# Patient Record
Sex: Female | Born: 1970 | Race: Black or African American | Hispanic: No | Marital: Single | Smoking: Current every day smoker
Health system: Southern US, Community
[De-identification: ages and names within clinical notes are randomized; demographics above are authoritative.]

## PROBLEM LIST (undated history)

## (undated) DIAGNOSIS — I10 Essential (primary) hypertension: Secondary | ICD-10-CM

## (undated) DIAGNOSIS — IMO0001 Reserved for inherently not codable concepts without codable children: Secondary | ICD-10-CM

## (undated) DIAGNOSIS — F17200 Nicotine dependence, unspecified, uncomplicated: Secondary | ICD-10-CM

## (undated) DIAGNOSIS — M2740 Unspecified cyst of jaw: Secondary | ICD-10-CM

## (undated) HISTORY — DX: Nicotine dependence, unspecified, uncomplicated: F17.200

## (undated) HISTORY — DX: Unspecified cyst of jaw: M27.40

## (undated) HISTORY — DX: Reserved for inherently not codable concepts without codable children: IMO0001

## (undated) HISTORY — DX: Essential (primary) hypertension: I10

---

## 1998-10-04 ENCOUNTER — Other Ambulatory Visit: Admission: RE | Admit: 1998-10-04 | Discharge: 1998-10-04 | Payer: Self-pay | Admitting: Obstetrics

## 2003-04-01 ENCOUNTER — Inpatient Hospital Stay (HOSPITAL_COMMUNITY): Admission: AD | Admit: 2003-04-01 | Discharge: 2003-04-01 | Payer: Self-pay | Admitting: *Deleted

## 2003-04-11 ENCOUNTER — Encounter: Admission: RE | Admit: 2003-04-11 | Discharge: 2003-04-11 | Payer: Self-pay | Admitting: Internal Medicine

## 2003-04-25 ENCOUNTER — Encounter: Admission: RE | Admit: 2003-04-25 | Discharge: 2003-04-25 | Payer: Self-pay | Admitting: Internal Medicine

## 2003-05-10 ENCOUNTER — Encounter (INDEPENDENT_AMBULATORY_CARE_PROVIDER_SITE_OTHER): Payer: Self-pay | Admitting: *Deleted

## 2003-05-10 ENCOUNTER — Encounter: Admission: RE | Admit: 2003-05-10 | Discharge: 2003-05-10 | Payer: Self-pay | Admitting: Obstetrics and Gynecology

## 2003-05-19 ENCOUNTER — Ambulatory Visit (HOSPITAL_COMMUNITY): Admission: RE | Admit: 2003-05-19 | Discharge: 2003-05-19 | Payer: Self-pay | Admitting: *Deleted

## 2003-05-30 ENCOUNTER — Encounter: Admission: RE | Admit: 2003-05-30 | Discharge: 2003-05-30 | Payer: Self-pay | Admitting: Internal Medicine

## 2003-07-28 ENCOUNTER — Encounter: Admission: RE | Admit: 2003-07-28 | Discharge: 2003-07-28 | Payer: Self-pay | Admitting: Obstetrics and Gynecology

## 2004-12-13 IMAGING — US US PELVIS COMPLETE MODIFY
1 series · 18 of 25 positions shown · non-contrast
Comparison: none

CLINICAL DATA: Heavy menses.  Evaluate for polycystic ovarian disease.
TRANSABDOMINAL AND ENDOVAGINAL PELVIC ULTRASOUND:
Multiple images of the uterus and adnexal were obtained using a transabdominal and endovaginal approaches.
The uterus has a maximal sagittal length of 7.9 cm and maximal AP width of 3.0 cm.  A homogeneous uterine myometrium is seen.  The endometrial canal is trilayered in appearance with a maximal AP width of .9 cm and would correlate with the patient?s periovulatory phase of the cycle and with the patient?s LMP of 05/06/03.
Both ovaries are seen with the left ovary measuring 2.5 x 3.9 x 2.1 cm and the left ovary measuring 2.5 x 3.0 x 1.8 cm.  Multiple small subcentimeter follicles are seen bilaterally with no dominant follicle identified in this periovulatory patient.  While volumetrically the ovaries are within normal limits for size, the presence of the multiple small follicles with no sign of a dominant follicle in the ovulatory phase of the cycle would correspond with the possibility of polycystic ovarian disease.  
IMPRESSION
Normal uterus.  
Ovaries demonstrating multiple peripherally situated subcentimeter follicles with a normal ovarian volume.  The appearance would be compatible with polycystic ovarian disease, although it is not diagnostic as such.  Please see above report for discussion.

[Series 1: us pelvis complete modify · 18 of 45 slices shown]
[im 1/45]
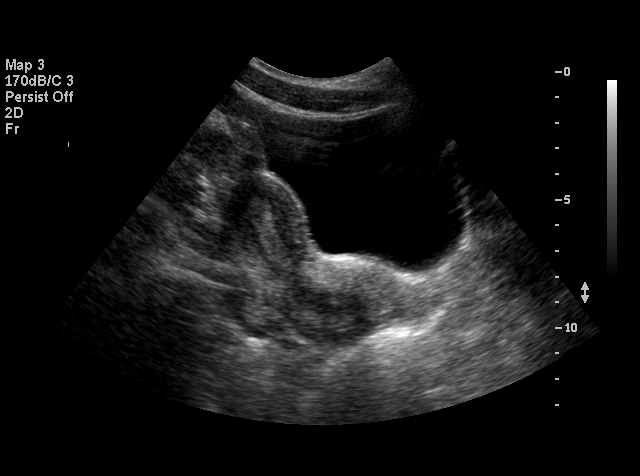
[im 4/45]
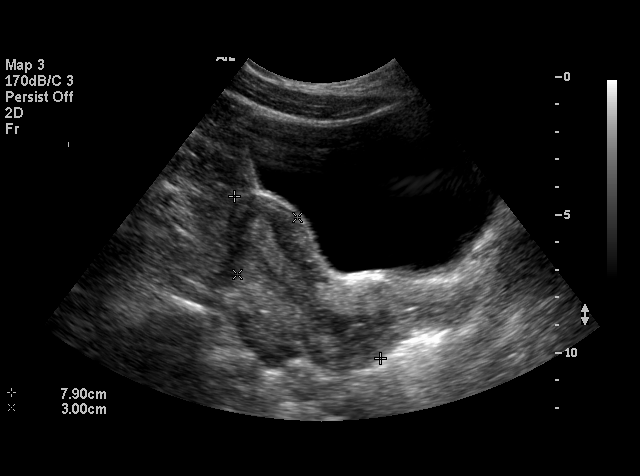
[im 6/45]
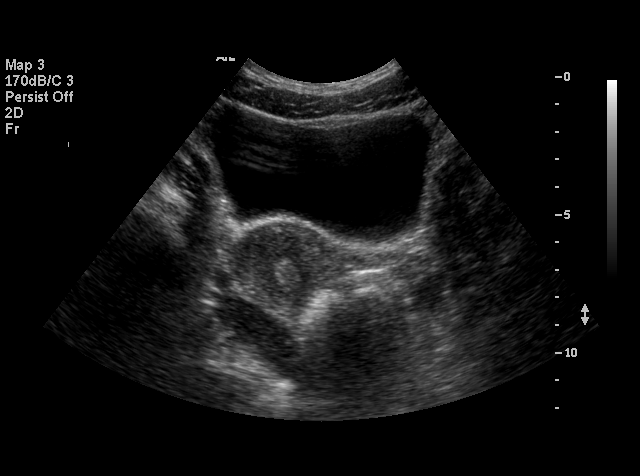
[im 8/45]
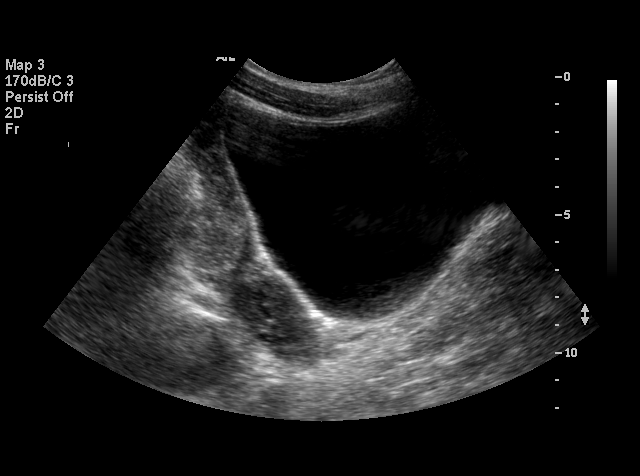
[im 12/45]
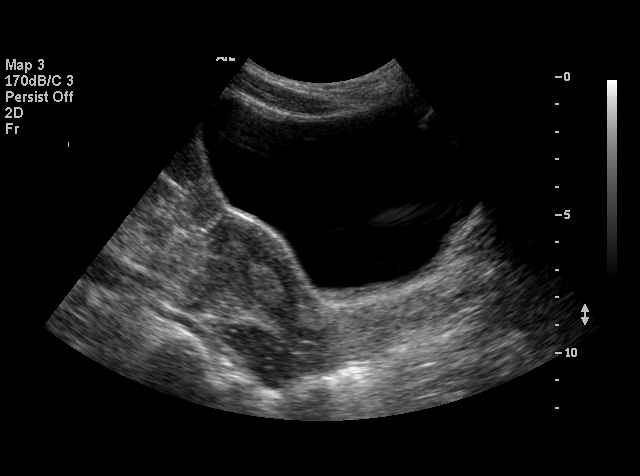
[im 13/45]
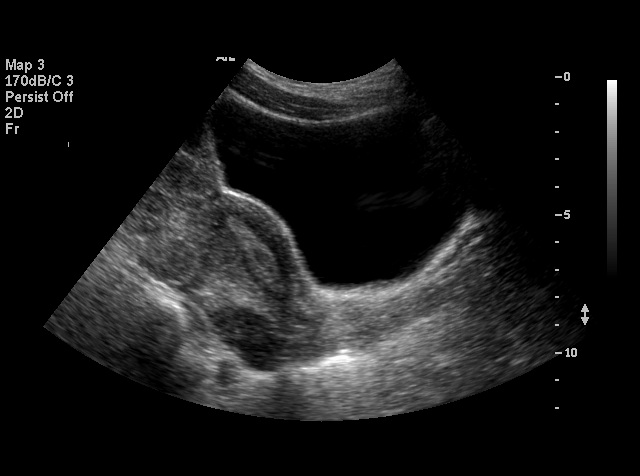
[im 17/45]
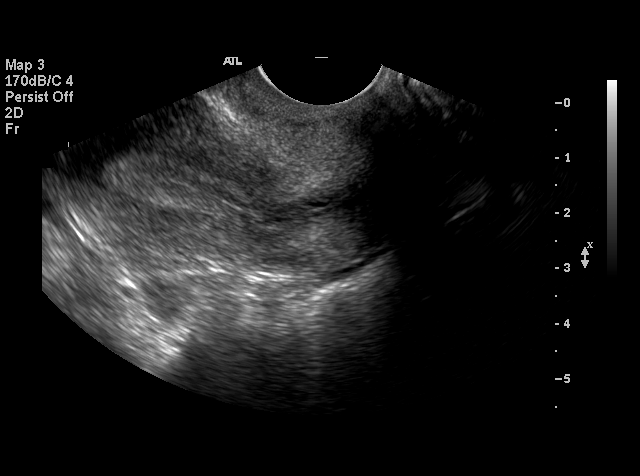
[im 19/45]
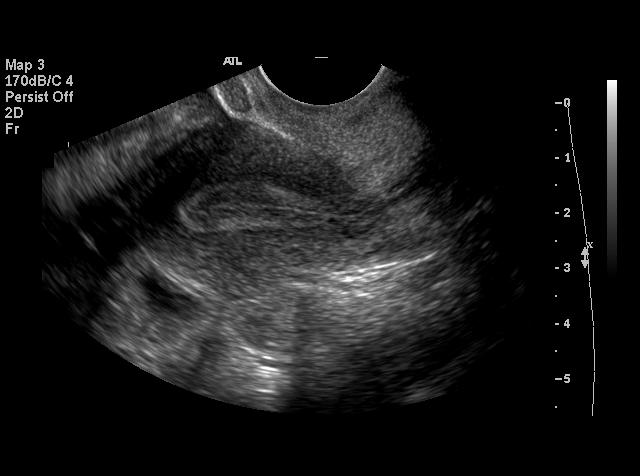
[im 21/45]
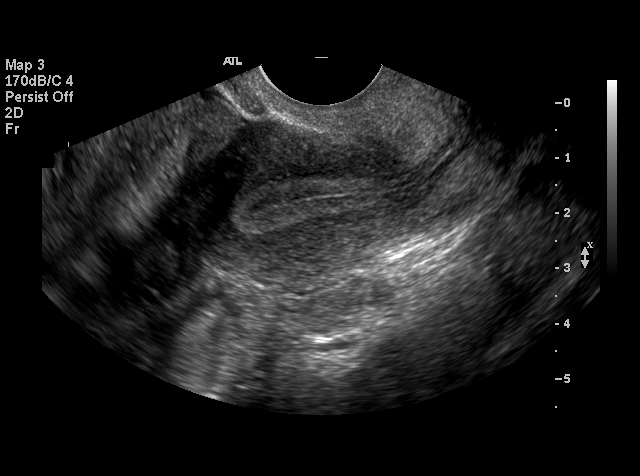
[im 24/45]
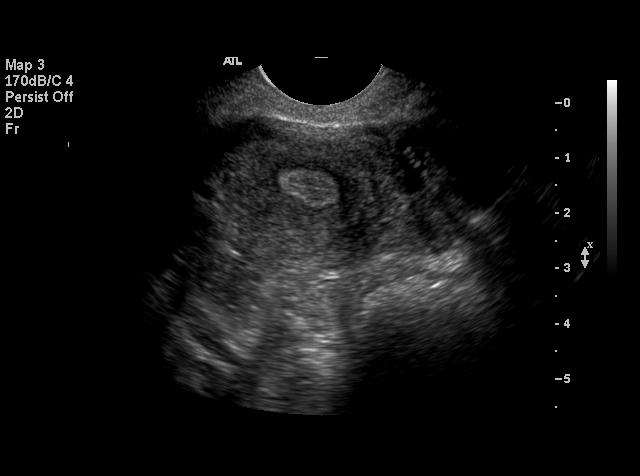
[im 26/45]
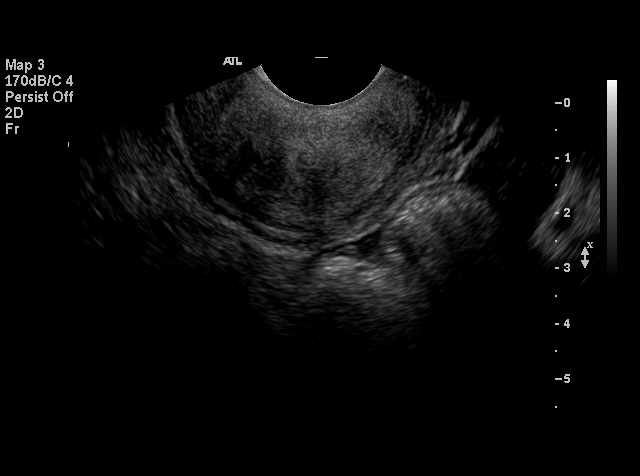
[im 28/45]
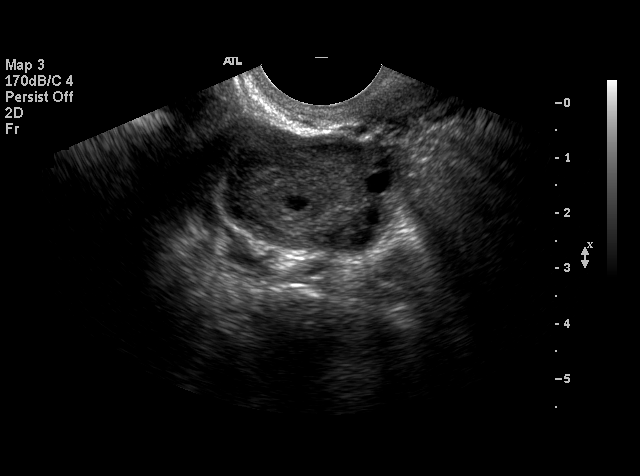
[im 32/45]
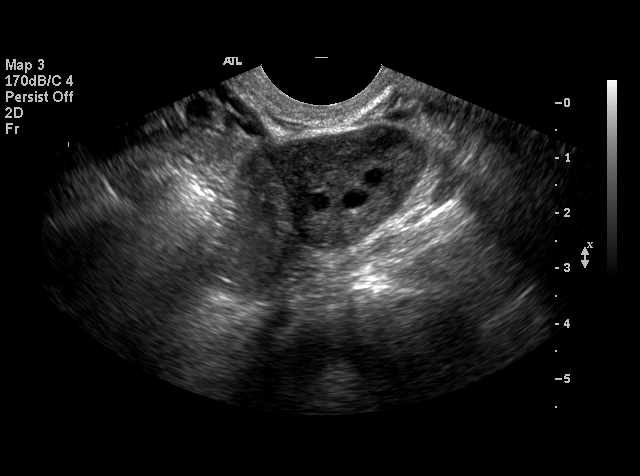
[im 34/45]
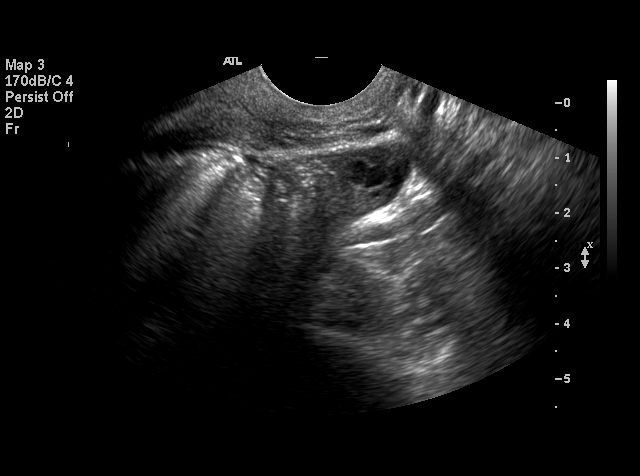
[im 37/45]
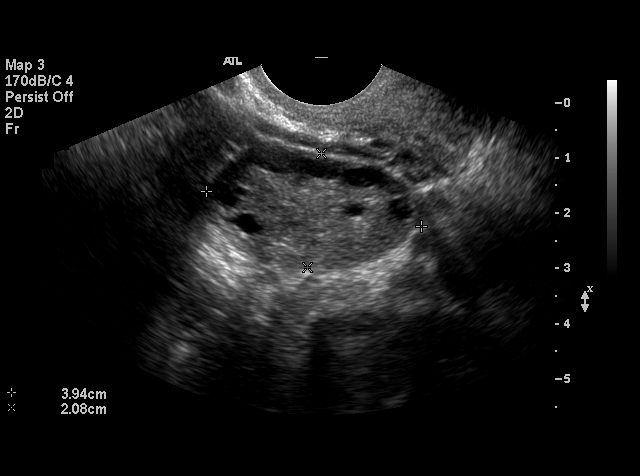
[im 39/45]
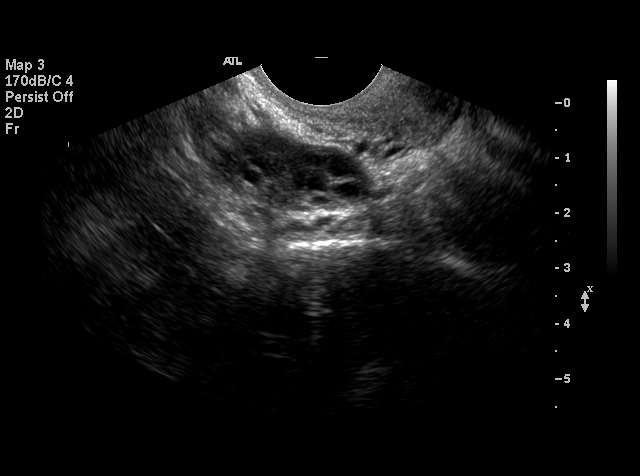
[im 41/45]
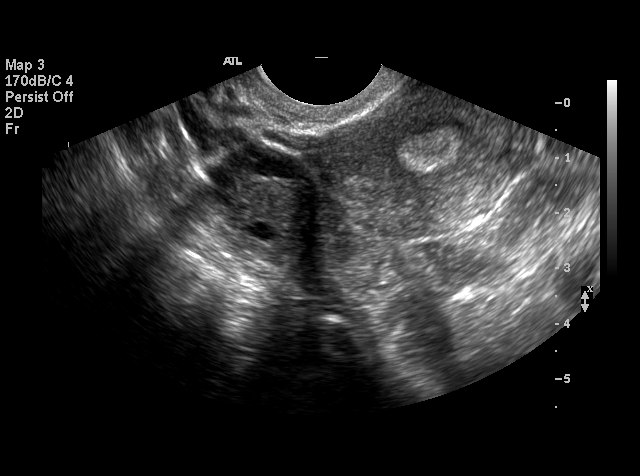
[im 45/45]
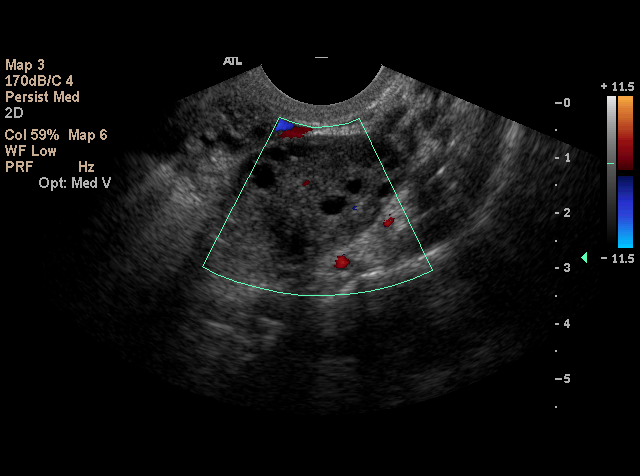

[18 of 25 positions shown; findings below may reference images not displayed]

## 2005-12-01 ENCOUNTER — Emergency Department (HOSPITAL_COMMUNITY): Admission: EM | Admit: 2005-12-01 | Discharge: 2005-12-01 | Payer: Self-pay | Admitting: Emergency Medicine

## 2006-09-11 ENCOUNTER — Emergency Department (HOSPITAL_COMMUNITY): Admission: EM | Admit: 2006-09-11 | Discharge: 2006-09-11 | Payer: Self-pay | Admitting: Emergency Medicine

## 2008-08-19 ENCOUNTER — Emergency Department (HOSPITAL_COMMUNITY): Admission: EM | Admit: 2008-08-19 | Discharge: 2008-08-19 | Payer: Self-pay | Admitting: Emergency Medicine
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
## 2010-06-11 ENCOUNTER — Inpatient Hospital Stay (HOSPITAL_COMMUNITY)
Admission: EM | Admit: 2010-06-11 | Discharge: 2010-06-16 | Payer: Self-pay | Source: Home / Self Care | Attending: Family Medicine | Admitting: Family Medicine

## 2010-06-11 ENCOUNTER — Emergency Department (HOSPITAL_COMMUNITY)
Admission: EM | Admit: 2010-06-11 | Discharge: 2010-06-11 | Disposition: A | Discharge status: Other Acute Inpatient Hospital | Payer: Self-pay | Source: Home / Self Care | Admitting: Emergency Medicine

## 2010-06-13 ENCOUNTER — Encounter: Payer: Self-pay | Admitting: Family Medicine

## 2010-06-14 ENCOUNTER — Encounter: Payer: Self-pay | Admitting: Family Medicine

## 2010-06-21 ENCOUNTER — Encounter: Payer: Self-pay | Admitting: Family Medicine

## 2010-06-21 ENCOUNTER — Ambulatory Visit: Admission: RE | Admit: 2010-06-21 | Discharge: 2010-06-21 | Payer: Self-pay | Source: Home / Self Care

## 2010-06-21 DIAGNOSIS — D638 Anemia in other chronic diseases classified elsewhere: Secondary | ICD-10-CM | POA: Insufficient documentation

## 2010-06-21 DIAGNOSIS — I10 Essential (primary) hypertension: Secondary | ICD-10-CM | POA: Insufficient documentation

## 2010-06-21 LAB — CONVERTED CEMR LAB
BUN: 23 mg/dL (ref 6–23)
CO2: 22 meq/L (ref 19–32)
Calcium: 8.9 mg/dL (ref 8.4–10.5)
Chloride: 105 meq/L (ref 96–112)
Creatinine, Ser: 1.97 mg/dL — ABNORMAL HIGH (ref 0.40–1.20)
Glucose, Bld: 63 mg/dL — ABNORMAL LOW (ref 70–99)
HCT: 29.3 % — ABNORMAL LOW (ref 36.0–46.0)
Hemoglobin: 9.5 g/dL — ABNORMAL LOW (ref 12.0–15.0)
MCHC: 32.4 g/dL (ref 30.0–36.0)
MCV: 99 fL (ref 78.0–100.0)
Platelets: 508 10*3/uL — ABNORMAL HIGH (ref 150–400)
Potassium: 4.8 meq/L (ref 3.5–5.3)
RBC: 2.96 M/uL — ABNORMAL LOW (ref 3.87–5.11)
RDW: 16.5 % — ABNORMAL HIGH (ref 11.5–15.5)
Sodium: 136 meq/L (ref 135–145)
WBC: 10.8 10*3/uL — ABNORMAL HIGH (ref 4.0–10.5)

## 2010-06-22 ENCOUNTER — Encounter: Payer: Self-pay | Admitting: Family Medicine

## 2010-06-22 ENCOUNTER — Telehealth: Payer: Self-pay | Admitting: Family Medicine

## 2010-07-06 ENCOUNTER — Ambulatory Visit: Admission: RE | Admit: 2010-07-06 | Discharge: 2010-07-06 | Payer: Self-pay | Source: Home / Self Care

## 2010-07-06 DIAGNOSIS — F17200 Nicotine dependence, unspecified, uncomplicated: Secondary | ICD-10-CM | POA: Insufficient documentation

## 2010-07-06 NOTE — H&P (Signed)
Mary Pitts, STAFF NO.:  [id number]  MEDICAL RECORD NO.:  [id number]          PATIENT TYPE:  INP  LOCATION:  2904                         FACILITY:  [facility]  PHYSICIAN:  Paula Compton, MD        DATE OF BIRTH:  02/25/1971  DATE OF ADMISSION:  06/11/2010 DATE OF DISCHARGE:                             HISTORY & PHYSICAL   PRIMARY CARE PHYSICIAN:  Unassigned.  CHIEF COMPLAINT:  Blurry vision.  HISTORY OF PRESENT ILLNESS:  This is a 40 year old female with a history of untreated hypertension who comes to her Urgent Care with several-day history of "feeling bad" and blurry vision, was sent to the ED from the urgent care where the patient was found to have a blood pressure of 235/157.  The patient endorses several days of nausea and emesis, but no chest pain, abdominal pain, shortness of breath or other complaints.  PAST MEDICAL HISTORY:  Hypertension.  PAST SURGICAL HISTORY:  None.  ALLERGIES:  No known drug allergies.  MEDICATIONS:  No medications.  SOCIAL HISTORY:  The patient lives alone with her boyfriend.  She has no children.  She is an International aid/development worker at [employer].  She smoked 1-1/2 packs per day x20 years.  Rare alcohol use.  No illicit drug use.  FAMILY HISTORY:  Mother died of cirrhosis due to alcoholism.  Father had an MI and coronary artery disease, unknown age.  Siblings, has a half- brother with a stroke at age 51.  REVIEW OF SYSTEMS:  Positive for headache, nausea, vomiting, nocturia, visual changes, dizziness, polyuria.  Negative for fevers, chills, weight change, chest pain, edema, palpitations, cough, wheezing, sputum, dysuria, hematemesis, numbness and weakness.  PHYSICAL EXAMINATION:  VITAL SIGNS:  Temperature 98, pulse 89, respirations 18, blood pressure currently 130/78, pulse ox 99% on room air. GENERAL:  No acute distress, hirsute female. HEENT:  Extraocular movements are intact.  Pupils are equally round and reactive  to light and accommodation.  No obvious papilledema or hemorrhage on funduscopic exam. NECK:  Supple. CARDIOVASCULAR:  Regular rate and rhythm, 2/6 early systolic murmur. LUNGS:  Clear to auscultation bilaterally. ABDOMEN:  Positive bowel sounds, soft, no tenderness to palpation. EXTREMITIES:  No lower extremity edema. NEURO:  Cranial nerves II-XII grossly intact.  No focal findings. MUSCULOSKELETAL:  Strength 5/5 in upper and lower extremities bilaterally.  LABORATORY FINDINGS AND STUDIES:  EKG; sinus rate 85, nonspecific ST changes. Chest x-ray; mild cardiomegaly, no active lung disease.  Alcohol level less than 5. Urinary drug screen negative. Cardiac enzymes, troponin 0.34, CK 104, CK-MB 4.3.  CBC; white blood cell count 13.62, hemoglobin 10, platelets 38. Complete metabolic panel; sodium 130, potassium 2, chloride 88, bicarbonate 33, BUN 24, creatinine 2.54, bilirubin 1.2, alk phos 48, AST 54, ALT 14, protein 6.4, albumin 3.2, calcium 8.6. Urinalysis positive for protein and large blood, negative for nitrites, leukocytes and glucose.  Microscopy, positive hyaline casts, negative white blood cells, rbcs too numerous to count.  ASSESSMENT AND PLAN:  This is a 40 year old with hypertensive urgency, acute renal failure, thrombocytopenia, hypokalemia. 1. Hypertensive urgency.  We will discontinue the patient's nitro and  labetalol drip as the patient's blood pressure today is much lower     than a goal of 25% reduction.  We will start metoprolol 50 mg p.o.     b.i.d. to keep blood pressure goal of systolic blood pressure 170-     190s and diastolic blood pressure of 100-105.  We will add a     calcium channel blocker if needed, additional agent after beta-     blockade.  We will recheck the patient's EKG.  Noted troponin     elevated expected due to renal failure and increased heart strains.     Further changes, will consult Cardiology. 2. Acute renal failure.  The patient  had a normal creatinine of 0.88     in March 2010.  Questionable is the patient has had acute     glomerulonephritis from hypertensive urgency or this is worsening     from chronic medical disease.  The patient also had many rbcs and     protein in her urine.  Consider renal ultrasound and Doppler to     further evaluate for signs of chronic medical disease versus to     evaluate for renal artery stenosis given poorly-controlled     hypertension.  Also obtain FENa, urine and sodium osmolality, 24-     hour urine and protein for further evaluation. 3. Hematology:  The patient has thrombocytopenia and anemia, not     previously to be seen in last laboratory studies in March 2010     greater than a year ago.  Question if the patient's anemia is from     blood loss from glomerulonephritis.  The patient has no history of     bleeding, also the patient's thrombocytopenia is of unclear     etiology.  We will obtain DIC panel, peripheral smear, and will get     HIV, hepatitis and labs to further evaluate. 4. Fluid, electrolytes and nutrition.  The patient is hypokalemic and     hyponatremic.  We will check a magnesium and recheck BMET for     potassium replacement.  The patient will likely need additional     replacement.  Questionable etiology due to possibly GI loss from     emesis versus renal losses versus mineralocorticoid     deficiency with renin-aldosterone ratio and continue to replete to     keep potassium greater than 4. 5. Prophylaxis.  SCDs due to thrombocytopenia. 6. Disposition.  We will discharge home pending workup.  The patient     will need PCP as an outpatient.     Delbert Harness, MD   ______________________________ Paula Compton, MD    KB/MEDQ  D:  06/12/2010  T:  06/12/2010  Job:  [id number]  Electronically Signed by Delbert Harness MD on 07/03/2010 11:19:05 PM Electronically Signed by Paula Compton MD on 07/06/2010 03:10:05 PM

## 2010-07-06 NOTE — Discharge Summary (Signed)
Mary Pitts, Mary Pitts             ACCOUNT NO.:  [id number]  MEDICAL RECORD NO.:  [id number]          PATIENT TYPE:  INP  LOCATION:  6737                         FACILITY:  [facility]  PHYSICIAN:  Wayne A. Sheffield Pitts, M.D.    DATE OF BIRTH:  [birth year]  DATE OF ADMISSION:  06/11/2010 DATE OF DISCHARGE:  06/16/2010                              DISCHARGE SUMMARY   DISCHARGE DIAGNOSES: 1. Hypertensive emergency. 2. Acute renal failure. 3. Chronic heart failure. 4. Anemia. 5. Thrombocytopenia.  DISCHARGE MEDICATIONS: 1. Metoprolol 100 mg p.o. b.i.d. 2. Clonidine 0.1 mg p.o. t.i.d. 3. Norvasc 10 mg p.o. daily. 4. Lisinopril 2.5 mg p.o. daily. 5. Aspirin 81 mg p.o. daily. 6. Clear Eyes ophthalmic solution over the counter p.r.n.  LABORATORY DATA:  Labs on admission showed CBC of 13.2, hemoglobin 10, hematocrit 29, platelets 38.  CMET showed sodium 130, potassium less than 2, chloride 88, CO2 of 33, glucose 99, BUN 24, creatinine 2.54, total bili 1.2, alk phos 48, AST 58, ALT 14, total protein 6.4, albumin 3.2, calcium 8.6, troponin was 0.34, CK-MB was 4.3 with a relative index of 4.1.  Urine drug screen was negative.  On admission, BNP was greater than 3200.  LABS AT DISCHARGE:  BNP was 1921.  BMET showed sodium 137, potassium 4.5, chloride 107, CO2 of 26, glucose 99, BUN 32, creatinine 2.58, calcium 8.9.  CBC showed WBC 9.4, hemoglobin 8.5, hematocrit 26.1, platelets 197.  PROCEDURES: 1. June 11, 2010, chest x-ray showed borderline to mild     cardiomegaly, no active lung disease. 2. CT abdomen and pelvis on June 12, 2010, showed bilateral     pleural effusions with interlobular septal thickening in the lower     lobes.  Coarse dystrophic calcification of indeterminate etiology.     Probably benign finding.  No evidence of mass. 3. June 12, 2010, Myoview was negative for pharmacologic stress-     induced ischemia.  Ejection fraction 41%.  Probable mild     attenuation of  the anterior wall. 4. 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  BRIEF HOSPITAL COURSE:  Mary Pitts is a 40 year old female who was admitted to the [facility] secondary to hypertensive emergency.  The patient was complaining of blurry vision and presented to Urgent Care on day of admission with blood pressure up to 235/157.  This was repeated.  The patient is therefore sent to the emergency department.  Please see H and P for full history and physical. 1. Hypertensive emergency.  This is actual hypertensive emergency as     the patient has evidence of end-organ damage.  The patient had     thrombocytopenia, acute renal failure, PRES syndrome, headache, and     blurry vision.  The patient was initially started on labetalol drip     and nitroglycerin in the emergency department.  This dropped her     blood  pressure greater than goal 25% reduction.  We therefore     stopped this and started on metoprolol 50 mg p.o. b.i.d.  This was     increased to metoprolol 100 mg p.o. b.i.d.  We had to continue     increasing her blood pressure medications so that she was     discharged home on Norvasc, Lopressor 100 mg, and clonidine 0.1 mg     t.i.d.  Troponin was also elevated.  Cardiology was consulted, felt     this was not likely secondary to cardiac event.  Myoview also ruled     out any ischemia.  Of note, the patient is being discharged on     clonidine 3 tablets a day.  It is not likely she will follow this     as it is difficult to take any medications 3 times a day.  We will     stop her lisinopril once her creatinine comes down and drop her     clonidine from the medication list. 2. Acute renal failure.  The patient has a normal creatinine.  Her     baseline creatinine 0.88  based on 2010 labs.  This is most likely     acute renal failure secondary to both most likely chronic kidney     disease as well as hypertensive emergency.  We did consult Renal     Service, we felt this was chronic kidney disease related with acute     renal failure on top of that.  They would like to follow up with     her on outpatient basis.  We did start the patient on lisinopril     2.5 mg, but the patient's creatinine continued to drop even on this     medication.  On discharge, her creatinine was 2.5 mg.  We need to     follow up her creatinine on an outpatient basis within the next     week to make sure she does not bump her creatinine any higher while     being on lisinopril.  Renal ultrasound was not obtained while she     was in-house.  CT of the abdomen and pelvis was negative for any     adrenal masses. 3. CHF.  Myoview did show the patient to have an EF of 40%.  On     discharge, she was on ACE and beta-blocker.  She did not have any     evidence of volume overload, therefore we are not having her on any     diuretics except spironolactone.  Her BNP was still elevated on     discharge, but was trending down from greater than 3200 on     admission.  This is likely chronic in nature.  Plan to continue her     on her medications so that I have her blood pressure to maximal     medical therapy.  The patient is to follow up with Dr. Algie Coffer,     Cardiology in 2 weeks.  As noted, the patient did not have any     signs of volume overload with no lower extremity edema and no     crackles on exam. 4. Anemia.  We did consult Hem/Onc during this admission.  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  The patient did also have thrombocytopenia     as well.  Please see below.  Hem/Onc consultation did agree that     the anemia is likely secondary to underlying cardiomyopathy and     chronic renal insufficiency.  The  patient did not require any     transfusions while in-house. 5. Thrombocytopenia likely secondary to dilution effect.  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  TTP was felt to be a possibility, but     after peripheral smear, she has decided to not actually diagnose     TTP.  Hem/Onc did agree that she probably had low-grade     disseminated intravascular coagulation as a side effect from her     uncontrolled hypertension.  This will also need to be followed up     in an outpatient basis.  DISCHARGE FOLLOWUP APPOINTMENTS:  The patient has a followup appointment already with Dr. Algie Coffer in 2 weeks.  She needs to be followed by [facility].  She also will be followed by [facility].  As it is a Saturday and a holiday, we are going to make appointment for her this weekend.  She was discharged home with instructions to call on Tuesday when the clinic reopens to schedule an appointment.  I have also flagged the administrative staff to call her if she does not call us.  DISCHARGE ISSUES AND FOLLOWUP:  As noted above, the patient was discharged home on Norvasc, Lopressor, clonidine, and lisinopril.  We would like to increase her lisinopril if possible to maximize medical management of cardiomyopathy.  We would also like to drop the clonidine so she does not have the problem of taking a medicine 3 times a day which is very difficult.  We will need to follow up with her creatinine within the next week to make sure that she is not having any problems her creatinine being on lisinopril and ensure that it is continuing to trend downwards.  Also need to obtain a CBC to maintain that her hemoglobin has indeed stabilized and platelets are continuing to trend upwards as they were on discharge.  DISCHARGE CONDITION:  The patient discharged to home in good medical condition.     Renold Don,  MD   ______________________________ Mary Pitts, M.D.    JW/MEDQ  D:  06/16/2010  T:  06/17/2010  Job:  [id number]  Electronically Signed by Renold Don  on 06/29/2010 08:45:32 AM Electronically Signed by Zachery Dauer M.D. on 07/06/2010 10:01:49 PM

## 2010-07-16 NOTE — Consult Note (Signed)
Mary Pitts, DAWE NO.:  [id number]  MEDICAL RECORD NO.:  [id number]           PATIENT TYPE:  LOCATION:                                 FACILITY:  PHYSICIAN:  Dr. Welton Flakes, Konrad Dolores               DATE OF BIRTH:  06-03-1971  DATE OF CONSULTATION:  06/13/2010 DATE OF DISCHARGE:                                CONSULTATION   REASON FOR CONSULTATION:  Thrombocytopenia - anemia.  REQUESTING PHYSICIAN:  Paula Compton, MD  HISTORY OF PRESENT ILLNESS:  [name] is a 40 year old African American female with a history of untreated hypertension, transferred from [facility] with blurry vision; malignant hypertension, reaching a systolic of 235, diastolic of 500; nausea and vomiting, requiring emergent treatment.  She was noted to have acute renal insufficiency.  In addition, the patient was found to be anemic with an H and H of 10 and 29 as well as a thrombocytopenia with platelet count of 38,000.  Her white count was 12.2.  These lab values were performed on June 11, 2010.  These findings are new when compared to prior labs.  Her DIC panel shows normal PTT and INR, her D-dimer was 3.32 and fibrinogen was 314.  Of note, a smear on June 11, 2010, showed a few schistocytes present.  A new smear confirms that.  LDH is elevated at 696.  Retic count has been ordered.  Other labs currently pending include haptoglobin, anemia panel, and SPEP.  Retic count is pending as well.  CT of the abdomen and pelvis on June 12, 2010, with no contrast, reveals coarse dystrophic calcifications in the splenic hilum, of unknown etiology, possibly benign.  In addition, bilateral pleural effusion with interlobular septal thickening in the lower lungs is seen. Adrenals are normal.  We were asked to see the patient in consultation with recommendations regarding her hematological abnormalities.  PAST MEDICAL HISTORY: 1. Hypertension, uncontrolled. 2. Hypertensive  cardiomyopathy, diagnosed this admission. 3. Renal insufficiency, diagnosed this admission. 4. Tobacco history. 5. 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 6. Ejection fraction 41% per 2-D echo, June 12, 2010.  PAST SURGICAL HISTORY - PROCEDURE:  Status post D and C at age 78, for what she describes as cancer, but there are no records.  ALLERGIES:  No known drug allergies.  MEDICATIONS:  Norvasc, Catapres, metoprolol, Protonix, K-Dur, Apresoline.  REVIEW OF SYSTEMS:  She denies any fever, chills, or night sweats.  No headaches or confusion.  She has had a dizziness and decrease in vision for 1 week prior to admission, now resolved.  No dysphagia.  No respiratory complaints.  No chest pain at this time.  No palpitations. She denies any cough or abdominal pain.  No early satiation or weight loss, no GERD symptoms.  She did have acute nausea and vomiting, now resolved.  Denies any blood in the stools or dark stools.  She denies blood in the urine, although she  noticed that is darker, accompanied by nocturia and polyuria.  No back pain.  No numbness or swelling.  She has increasing fatigue.  Of note, the patient admits to eat very significant amount of ice, sometimes a whole bag of ice.  In addition, she drinks major amounts of soda a day.  Rest of the review of systems is negative. Her menarche was at age 74.  She admits to irregular periods.  Last menstrual period was in early December, and lasted a week.  She did not note a significant amount of clots.  FAMILY HISTORY:  Mother died with alcoholic cirrhosis.  Father died with CAD.  She has one half brother, who had CVA at age 51.  SOCIAL HISTORY:  The patient is single.  She lives with boyfriend.  No children.  She smokes one and a half pack a day of cigarettes for at least 20 years.  She is International aid/development worker at  [employer]  Lives in [place].  On health maintenance, the patient is not up to date with primary care.  PHYSICAL EXAMINATION:  GENERAL:  This is a thin 40 year old [place] American female, in no acute distress, alert and oriented x3. VITAL SIGNS:  Blood pressure 160/110, pulse 98, respirations 20, temperature 97.8, pulse oximetry 94% in room air. HEENT:  Normocephalic, atraumatic.  PERRLA.  Sclerae anicteric.  Oral cavity without lesions or thrush.  There is presence of significant amount of hirsutism. NECK:  Supple.  No cervical or supraclavicular masses. LUNGS:  Clear to auscultation bilaterally with no axillary masses. CARDIOVASCULAR:  Regular rate and rhythm.  A 2/6 systolic murmur.  No rubs or gallops. ABDOMEN:  Soft, nontender.  Bowel sounds x4.  No hepatosplenomegaly. There is a presence of abdominal hair. EXTREMITIES:  No clubbing or cyanosis.  No edema.  No inguinal masses. SKIN:  Without bruising or petechial rash. BREASTS:  Not examined. GENITOURINARY:  Deferred. RECTAL:  Deferred. MUSCULOSKELETAL:  No spinal tenderness. NEUROLOGIC:  Nonfocal.  Last hemoglobin 7.9, hematocrit 22.7, white count 55, white count 12.2, MCV 93.  For a white count of 13.2 on June 11, 2010, her ANC was 10.5, monocytes 1.1, lymphocytes 1.5.  LDH 696, D-dimer 3.32.  Sodium 134, potassium 2.8, BUN 25, creatinine 2.45, glucose 99.  Total bilirubin 1.2, alkaline phosphatase 48, AST 58, ALT 14, total protein 6.4, albumin 3.2, calcium 8.8, TSH 1.158, magnesium 1.8.  Troponin 0.32, PTT 27, PT 14.1, INR 1.07, blood cultures negative.  HIV negative.  BNP greater than 3200.  Hepatitis is negative.  Urine sodium 43, normal urine cultures, nondiagnostic, alcohol negative, drug screen negative, MRSA negative.  UA positive for 300 of protein, large blood, cloudy, no nitrites.  ASSESSMENT AND PLAN:  Dr. Welton Flakes has seen and evaluated the patient.  This is a 40 year old woman with uncontrolled  hypertension, hypertensive cardiomyopathy, renal insufficiency, likely of longstanding duration. The patient has not been followed by a physician in quite some time. The patient has not been taking blood pressure medications.  She was admitted with anemia and thrombocytopenia and thus will be evaluated and managed for: 1. Anemia, likely secondary to underlying cardiomyopathy and chronic     disease, renal insufficiency.  Suggest transfusion when     symptomatic, but we will avoid fluid overload.  The patient may     benefit from erythrocyte-stimulating agents therapy, erythrocyte-     stimulating agents, but we need to use them carefully in this     patient with uncontrolled hypertension and cardiomyopathy.  Once     her hypertension is controlled, these agents may be appropriate,     but not now. 2. Thrombocytopenia, likely secondary to dilution effect.  Thrombotic     thrombocytopenic purpura is not thought at this time.  Dr. Welton Flakes has     reviewed her peripheral smear, and they are the same schistocytes,     but not diagnostic of thrombotic thrombocytopenic purpura.  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 Suggest monitor her CBC, LDH, and total bilirubin.  Thank you very much for allowing [place] the opportunity to participate in the care of this nice patient.     Marlowe Kays, P.A.   ______________________________ Drue Second, MD    SW/MEDQ  D:  06/14/2010  T:  06/15/2010  Job:  [id number]  Electronically Signed by Marlowe Kays P.A. on 06/15/2010 08:04:43 AM Electronically Signed by Drue Second MD on 07/16/2010 05:41:28 PM

## 2010-07-18 ENCOUNTER — Telehealth (INDEPENDENT_AMBULATORY_CARE_PROVIDER_SITE_OTHER): Payer: Self-pay | Admitting: *Deleted

## 2010-07-19 NOTE — Assessment & Plan Note (Signed)
Summary: 2 WKS F/U VISIT PER MD/RH   Vital Signs:  Patient profile:   40 year old female Height:      64.75 inches Weight:      145.6 pounds BMI:     24.50 Pulse rate:   76 / minute BP sitting:   128 / 85  (left arm)  Vitals Entered By: Theresia Lo RN (July 06, 2010 1:31 PM) CC: follow up Is Patient Diabetic? No Pain Assessment Patient in pain? no        Primary Provider:  Dessa Phi  CC:  follow up.  History of Present Illness: HTN f/u  HTN- Reviewed meds. Pt taking all as prescribed. Elevated BP at visit with Cardiologist (Dr. Algie Coffer) 167/110. Dr. Algie Coffer prescribed Norvasc 5 mg by mouth q d. Pt wanted to see me before filling script. BP this AM at home 160/124. Pt denies CP, SOB, worsening blurred vision.   For vision: pt [facility]ulated at [facility] with dilated eye exam. Referred to an Ophthalmologist for evaluation for Lasik.  Diet- pt diet is low salt. Limited. Not enjoying food.   Stress- work related. stress. Smoking a few puffs daily at work. Trying hard to cut back. Interested in other stress relievers. Ready to quit.   Exercise- no regular. Has a WII. Has an overweight dog and a park across the street.   Screening: no pap done in a few years. No mammaogram done ever.   Allergies: No Known Drug Allergies  Physical Exam  General:  Well-developed,well-nourished,in no acute distress; alert,appropriate and cooperative throughout examination Lungs:  Normal respiratory effort, chest expands symmetrically. Lungs are clear to auscultation, no crackles or wheezes. Heart:  Normal rate and regular rhythm. S1 and S2 normal without gallop, murmur, click, rub or other extra sounds. Skin:  Healing R foot blister that has ruptured.      Impression & Recommendations:  Problem # 1:  HYPERTENSION (ICD-401.9) Assessment Deteriorated Was better controlled on previous regimen. Restart norvasc per Cards. Pt to f/u with cards in 1-2 weeks. Check CBC and Cr  at f/u visit 4 weeks. Her updated medication list for this problem includes:    Lopressor 100 Mg Tabs (Metoprolol tartrate) ..... One tab two times a day    Lisinopril 5 Mg Tabs (Lisinopril) ..... One tab daily    Hydrochlorothiazide 25 Mg Tabs (Hydrochlorothiazide) ..... One tab po daily    Norvasc 5 Mg Tabs (Amlodipine besylate) ..... One tab daily at lunch  Future Orders: Comp [facility] [id number]) ... 06/19/2011 CBC[facility] ([id number]) ... 06/18/2011  Problem # 2:  Preventive Health Care (ICD-V70.0) Screening pap and mamogram at f/u visit.  Problem # 3:  TOBACCO USER (ICD-305.1) Pt ready to quit. Interested in other relaxation techniques.  Trying to simulate smoking for relief as suggested by cardiologist. Interesed in exercising (walking during work breaks etc.) Will assess progress at next visit.   Complete Medication List: 1)  Lopressor 100 Mg Tabs (Metoprolol tartrate) .... One tab two times a day 2)  Lisinopril 5 Mg Tabs (Lisinopril) .... One tab daily 3)  Aspir-low 81 Mg Tbec (Aspirin) .... One tab daily. 4)  Hydrochlorothiazide 25 Mg Tabs (Hydrochlorothiazide) .... One tab po daily 5)  Norvasc 5 Mg Tabs (Amlodipine besylate) .... One tab daily at lunch  Patient Instructions: 1)  Ms. Su Hilt, 2)  Thank you for coming in.  3)  For your BP: 4)  Start taking new medicine Amlodipine at lunch. 5)  Keep your f/u appt w/ Dr. Algie Coffer.  6)  Bring you BP monitor at your next visit. 7)  -lab draw befrore f/u visit-CBC and CMET.  8)  -f/u w/ me in 4 weeks.  9)  -Dr. Armen Pickup   Orders Added: 1)  Comp Met[facility] [[id number]] 2)  CBC[facility] [[id number]] 3)  [facility] [99203]     Vital Signs:  Patient profile:   40 year old female Height:      64.75 inches Weight:      145.6 pounds BMI:     24.50 Pulse rate:   76 / minute BP sitting:   128 / 85  (left arm)  Vitals Entered By: Theresia Lo RN (July 06, 2010 1:31 PM)   Serial Vital Signs/Assessments:  Time      Position   BP       Pulse  Resp  Temp     By 1:35 PM             132/90                         Theresia Lo RN                     132/90                         Dessa Phi MD                     130/90                         Dessa Phi MD above BP taken manually. Theresia Lo RN  July 06, 2010 1:36 PM   Prevention & Chronic Care Immunizations   Influenza vaccine: Not documented    Tetanus booster: Not documented    Pneumococcal vaccine: Not documented  Other Screening   Pap smear: Not documented   Pap smear action/deferral: Deferred  (07/06/2010)   Pap smear due: 08/15/2010    Mammogram: Not documented   Mammogram action/deferral: Deferred  (07/06/2010)   Mammogram due: 08/15/2010   Smoking status: quit  (06/21/2010)  Lipids   Total Cholesterol: Not documented   LDL: Not documented   LDL Direct: Not documented   HDL: Not documented   Triglycerides: Not documented  Hypertension   Last Blood Pressure: 128 / 85  (07/06/2010)   Serum creatinine: 1.97  (06/21/2010)   Serum potassium 4.8  (06/21/2010) CMP ordered   Self-Management Support :    Hypertension self-management support: Not documented

## 2010-07-19 NOTE — Letter (Signed)
Summary: Out of Work  [facility]  [address]   [place], [place] [address]   Phone: [phone]  Fax: [phone]    June 21, 2010   Employee:  Mary Pitts    To Whom It May Concern:   For Medical reasons, please excuse the above named employee from work for the following dates:  Start:   06/11/10  End:   06/22/10  [name] may return to work on Monday, Jun 25, 2010. Please excuse her for all follow-up doctor appointments.   If you need additional information, please feel free to contact our office.         Sincerely,    Dessa Phi MD

## 2010-07-19 NOTE — Miscellaneous (Signed)
Summary: Discharge Summary  Clinical Lists Changes  NAME:  Mary Pitts             ACCOUNT NO.:  [id number]      MEDICAL RECORD NO.:  [id number]          PATIENT TYPE:  INP      LOCATION:  6737                         FACILITY:  [facility]      PHYSICIAN:  Wayne A. Sheffield Slider, M.D.    DATE OF BIRTH:  07/27/1970      DATE OF ADMISSION:  06/11/2010   DATE OF DISCHARGE:  06/16/2010                                  DISCHARGE SUMMARY         DISCHARGE DIAGNOSES:   1. Hypertensive emergency.   2. Acute renal failure.   3. Chronic heart failure.   4. Anemia.   5. Thrombocytopenia.      DISCHARGE MEDICATIONS:   1. Metoprolol 100 mg p.o. b.i.d.   2. Clonidine 0.1 mg p.o. t.i.d.   3. Norvasc 10 mg p.o. daily.   4. Lisinopril 2.5 mg p.o. daily.   5. Aspirin 81 mg p.o. daily.   6. Clear Eyes ophthalmic solution over the counter p.r.n.      LABORATORY DATA:  Labs on admission showed CBC of 13.2, hemoglobin 10,   hematocrit 29, platelets 38.  CMET showed sodium 130, potassium less   than 2, chloride 88, CO2 of 33, glucose 99, BUN 24, creatinine 2.54,   total bili 1.2, alk phos 48, AST 58, ALT 14, total protein 6.4, albumin   3.2, calcium 8.6, troponin was 0.34, CK-MB was 4.3 with a relative index   of 4.1.  Urine drug screen was negative.  On admission, BNP was greater   than 3200.      LABS AT DISCHARGE:  BNP was 1921.  BMET showed sodium 137, potassium   4.5, chloride 107, CO2 of 26, glucose 99, BUN 32, creatinine 2.58,   calcium 8.9.  CBC showed WBC 9.4, hemoglobin 8.5, hematocrit 26.1,   platelets 197.      PROCEDURES:   1. June 11, 2010, chest x-ray showed borderline to mild       cardiomegaly, no active lung disease.   2. CT abdomen and pelvis on June 12, 2010, showed bilateral       pleural effusions with interlobular septal thickening in the lower       lobes.  Coarse dystrophic calcification of indeterminate etiology.       Probably benign finding.  No evidence of mass.   3. June 12, 2010, Myoview was negative for pharmacologic stress-       induced ischemia.  Ejection fraction 41%.  Probable mild       attenuation of the anterior wall.   4. MRI of the brain without contrast showed Chiari 1 malformation with       cerebellar tonsillar herniation of the foramen magnum of 7 mm.       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      BRIEF HOSPITAL COURSE:  Mary Pitts is a 40 year old female who was   admitted to the [facility]  secondary to   hypertensive emergency.  The patient was complaining of blurry vision   and presented to Urgent Care on day of admission with blood pressure up   to 235/157.  This was repeated.  The patient is therefore sent to the   emergency department.  Please see H and P for full history and physical.   1. Hypertensive emergency.  This is actual hypertensive emergency as       the patient has evidence of end-organ damage.  The patient had       thrombocytopenia, acute renal failure, PRES syndrome, headache, and       blurry vision.  The patient was initially started on labetalol drip       and nitroglycerin in the emergency department.  This dropped her       blood pressure greater than goal 25% reduction.  We therefore       stopped this and started on metoprolol 50 mg p.o. b.i.d.  This was       increased to metoprolol 100 mg p.o. b.i.d.  We had to continue       increasing her blood pressure medications so that she was       discharged home on Norvasc, Lopressor 100 mg, and clonidine 0.1 mg       t.i.d.  Troponin was also elevated.  Cardiology was consulted, felt       this was not likely secondary to cardiac event.  Myoview also ruled       out any ischemia.  Of note, the patient is being discharged on       clonidine 3 tablets a day.  It is not likely she will follow this       as it is difficult to take any medications 3 times a day.  We will        stop her lisinopril once her creatinine comes down and drop her       clonidine from the medication list.   2. Acute renal failure.  The patient has a normal creatinine.  Her       baseline creatinine 0.88 based on 2010 labs.  This is most likely       acute renal failure secondary to both most likely chronic kidney       disease as well as hypertensive emergency.  We did consult Renal       Service, we felt this was chronic kidney disease related with acute       renal failure on top of that.  They would like to follow up with       her on outpatient basis.  We did start the patient on lisinopril       2.5 mg, but the patient's creatinine continued to drop even on this       medication.  On discharge, her creatinine was 2.5 mg.  We need to       follow up her creatinine on an outpatient basis within the next       week to make sure she does not bump her creatinine any higher while       being on lisinopril.  Renal ultrasound was not obtained while she       was in-house.  CT of the abdomen and pelvis was negative for any       adrenal masses.   3. CHF.  Myoview did show the patient to have an EF of 40%.  On  discharge, she was on ACE and beta-blocker.  She did not have any       evidence of volume overload, therefore we are not having her on any       diuretics except spironolactone.  Her BNP was still elevated on       discharge, but was trending down from greater than 3200 on       admission.  This is likely chronic in nature.  Plan to continue her       on her medications so that I have her blood pressure to maximal       medical therapy.  The patient is to follow up with Dr. Algie Coffer,       Cardiology in 2 weeks.  As noted, the patient did not have any       signs of volume overload with no lower extremity edema and no       crackles on exam.   4. Anemia.  We did consult Hem/Onc during this admission.  Felt that       her anemia was secondary to microangiopathic homolysis  secondary to       hypertensive urgency.  The patient did also have thrombocytopenia       as well.  Please see below.  Hem/Onc consultation did agree that       the anemia is likely secondary to underlying cardiomyopathy and       chronic renal insufficiency.  The patient did not require any       transfusions while in-house.   5. Thrombocytopenia likely secondary to dilution effect.  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  TTP was felt to be a possibility, but       after peripheral smear, she has decided to not actually diagnose       TTP.  Hem/Onc did agree that she probably had low-grade       disseminated intravascular coagulation as a side effect from her       uncontrolled hypertension.  This will also need to be followed up       in an outpatient basis.      DISCHARGE FOLLOWUP APPOINTMENTS:  The patient has a followup appointment   already with Dr. Algie Coffer in 2 weeks.  She needs to be followed by   [facility].  She also will be followed by [facility]   [facility].  As it is a Saturday and he was in [place], we are   going to make appointment for her this weekend.  She was discharged home   with instructions to call on Tuesday when the clinic reopens to schedule   an appointment.  I have also flagged the administrative staff to call   her if she does not call us.      DISCHARGE ISSUES AND FOLLOWUP:  As noted above, the patient was   discharged home on Norvasc, Lopressor, and clonidine.  We would like to   discharge her on Norvasc, Lopressor, clonidine, and lisinopril.  We   would like to increase her lisinopril and possible to maximize medical   management of cardiomyopathy.  We would also like to drop the clonidine   so she does not have problem taking the medicine 3 times a day which is   very difficult.  We will need to follow up with her creatinine within  the next week to make sure  that she is not having any problems her   creatinine being on lisinopril and ensure that it is continuing to trend   downwards.  Also need to obtain a CBC to maintain that her hemoglobin   has indeed stabilized and platelets are continuing to trend upwards as   they were on discharge.      DISCHARGE CONDITION:  The patient discharged to home in good medical   condition.               Renold Don, MD         ______________________________   Arnette Norris Sheffield Slider, M.D.            JW/MEDQ  D:  06/16/2010  T:  06/17/2010  Job:  [id number]

## 2010-07-19 NOTE — Letter (Signed)
Summary: Out of Work  [facility]  [address]   [place], [place] [address]   Phone: [phone]  Fax: [phone]    June 22, 2010   Employee:  Mary Pitts    To Whom It May Concern:   For Medical reasons, please excuse the above named employee from work for the following dates:  Start:   06/11/10  End:   06/22/10  [name] may return to work on Monday, Jun 25, 2010 without restrictions. Please excuse her for all follow-up doctor appointments.  If you need additional information, please feel free to contact our office.         Sincerely,    Dessa Phi MD

## 2010-07-19 NOTE — Progress Notes (Signed)
Summary: Letter  Phone Note Call from Patient Call back at Home Phone [phone]   Summary of Call: needs new letter out of work stating the same thing but must also include that she does NOT have any restrictions, may fax letter to Laurette Schimke at [phone] Initial call taken by: Knox Royalty,  June 22, 2010 11:09 AM

## 2010-07-19 NOTE — Assessment & Plan Note (Signed)
Summary: hospital f/u per breen/eo   Vital Signs:  Patient profile:   40 year old female Height:      64.75 inches Weight:      150.19 pounds BMI:     25.28 BSA:     1.75 Temp:     98.1 degrees F Pulse rate:   78 / minute BP sitting:   122 / 82  Vitals Entered By: Jone Baseman CMA (June 21, 2010 9:04 AM) CC: hfu--HTN Is Patient Diabetic? No Pain Assessment Patient in pain? no        CC:  hfu--HTN.  History of Present Illness: Concern(s): f/u HTN, R foot blister  1. HTN  Here to f/u hospital D/C for HTN urgency. D/C on 06/16/10 Taking medications as prescribed.  ROS: denies HA, denies dizziness blurred vision on a distance, proximal vision is clear, pt denies dbl vision, denies CP, deneis SOB, denies LE edema, normal urine, no hematuria. Eneergy improved.   2. R foot blister: present when pt was in the hospital. Unclear etiology. No other blisters. Non-tender. Pt lanced it at home, clear fluid. Applying neosporin and dressing the foot.   3. 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 Pt denies fatiuge, palpitations, CP, syncope. Currently having menstrual period.    Habits & Providers  Alcohol-Tobacco-Diet     Tobacco Status: quit     Year Quit: 12.26.11  Current Medications (verified): 1)  Lopressor 100 Mg Tabs (Metoprolol Tartrate) .... One Tab Two Times A Day 2)  Clonidine Hcl 0.1 Mg Tabs (Clonidine Hcl) .... One Tab Three Times A Day 3)  Norvasc 10 Mg Tabs (Amlodipine Besylate) .... One Tab Daily. 4)  Lisinopril 2.5 Mg Tabs (Lisinopril) .... One Tab Daily 5)  Aspir-Low 81 Mg Tbec (Aspirin) .... One Tab Daily.  Allergies (verified): No Known Drug Allergies  Past History:  Past Medical History: Hypertension GO  Past Surgical History: None  Family History: Father- alive age 33, HTN, CAD, prostate CA,  Mother-deceased at age 43,  alcoholic cirrhosis. Diabetes-maternal aunt.   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 Education: 12th grade Lives with boyfriend Tobie Lords) and dog.  Previous smoker 1/2 PPD X 22 yrs. Quit 06/11/10. Alcohol- quite 2 years ago. IDU- Marijuana in the past. Denies cocaine, heroin. Smoking Status:  quit  Review of Systems       As per HPI  Physical Exam  General:  Well-developed,well-nourished,in no acute distress; alert,appropriate and cooperative throughout examination Eyes:  vision grossly intact, pupils equal, pupils round, pupils reactive to light, pupils react to accomodation, and no injection.   Lungs:  Normal respiratory effort, chest expands symmetrically. Lungs are clear to auscultation, no crackles or wheezes. Heart:  Normal rate and regular rhythm. S1 and S2 normal without gallop, murmur, click, rub or other extra sounds. Pulses:  R DP 2+ Extremities:  no edema.  Neurologic:  alert & oriented X3 and cranial nerves II-XII intact.   Skin:  R foot blister, 2x2 cm, pale, draining clear fluid, non-tender.    Impression & Recommendations:  Problem # 1:  HYPERTENSION (ICD-401.9) Assessment Improved f.u Cr: 2.54--> 2.58 during hospital stay.  See pt instructions for instructions regaeding clonidine taper.  The following medications were removed from the medication list:    Clonidine Hcl 0.1 Mg Tabs (Clonidine hcl) ..... One tab three times a day    Norvasc 10 Mg Tabs (Amlodipine besylate) ..... One tab daily. Her updated  medication list for this problem includes:    Lopressor 100 Mg Tabs (Metoprolol tartrate) ..... One tab two times a day    Lisinopril 5 Mg Tabs (Lisinopril) ..... One tab daily    Hydrochlorothiazide 25 Mg Tabs (Hydrochlorothiazide) ..... One tab po daily  Orders: Basic Met-[facility] (62952-84132)  Problem # 2:  ANEMIA OF OTHER CHRONIC DISEASE (ICD-285.29) Assessment: Comment Only f/u H/H and Plt. Anemia and thrombocytopenia believed to be microangiopathic hemolytic anemia 2/2  to hypetensive emergency.  Plt: 38 --> 197 during hospital stay.  Hgb: 10 --> 8.5 during hospital stay.  Orders: CBC-[facility] (44010)  Problem # 3:  BLISTER, RIGHT FOOT (ICD-917.2) Assessment: Comment Only No evidence of 2/2 infection.  Continue dressing and neosporin.  Complete Medication List: 1)  Lopressor 100 Mg Tabs (Metoprolol tartrate) .... One tab two times a day 2)  Lisinopril 5 Mg Tabs (Lisinopril) .... One tab daily 3)  Aspir-low 81 Mg Tbec (Aspirin) .... One tab daily. 4)  Hydrochlorothiazide 25 Mg Tabs (Hydrochlorothiazide) .... One tab po daily  Patient Instructions: 1)  Mary Pitts, 2)  It was a pleasure meeting you. 3)  Your BP is well controlled: I am making changes to your medications. 4)  1. Increasing lisinopril dose to 5 mg daily.  5)  2. Weaning off clonidine- take one tab two times a day x 2 days, then one tab once daily x 2 days. Check your BPs daily while dosing this to make sure they are not jumping up >150/100. 6)  3. Stopping norvasc. 7)  4. Starting HCTZ. 8)  -Keep f/u appt with your cardiologist. 9)  -Schedule an appt with your Ophthalmologist. 10)  -f/u w/ me in 2 weeks.  11)  -Dr. Armen Pickup Prescriptions: HYDROCHLOROTHIAZIDE 25 MG TABS (HYDROCHLOROTHIAZIDE) one tab Po daily  #60 x 1   Entered and Authorized by:   Dessa Phi MD   Signed by:   Dessa Phi MD on 06/21/2010   Method used:   [facility] to        [facility]  [address] [phone]* (retail)       [address]       [place], [place]  [id number]       Ph: [phone] or [phone]       Fax: [phone]   RxID:   [id number] LOPRESSOR 100 MG TABS (METOPROLOL TARTRATE) one tab two times a day  #60 x 1   Entered and Authorized by:   Dessa Phi MD   Signed by:   Dessa Phi MD on 06/21/2010   Method used:   [facility]        [facility]  [address] [phone]* (retail)       [address]       [place],  [place]  [id number]       Ph: [phone] or [phone]       Fax: [phone]   RxID:   [id number] LISINOPRIL 5 MG TABS (LISINOPRIL) one tab daily  #30 x 0   Entered and Authorized by:   Dessa Phi MD   Signed by:   Dessa Phi MD on 06/21/2010   Method used:   [facility]        [facility]  [address] [phone]* (retail)       [address]       [place], [place]  [address]       Ph: [phone] or [phone]       Fax: [phone]   RxID:   [id number] LOPRESSOR 100 MG TABS (METOPROLOL TARTRATE) one tab two times a day  #60 x 2   Entered and Authorized by:   Dessa Phi MD   Signed by:   Dessa Phi MD on 06/21/2010   Method used:   Print then Give to Patient   RxID:   [id number]    Orders Added: 1)  Basic Met[facility] [[id number]] 2)  CBC[facility] [85027] 3)  [facility] [99204]     Prevention & Chronic Care Immunizations   Influenza vaccine: Not documented    Tetanus booster: Not documented    Pneumococcal vaccine: Not documented  Other Screening   Pap smear: Not documented    Mammogram: Not documented   Smoking status: quit  (06/21/2010)    Screening comments: Last pap 5 year ago.   Lipids   Total Cholesterol: Not documented   LDL: Not documented   LDL Direct: Not documented   HDL: Not documented   Triglycerides: Not documented  Hypertension   Last Blood Pressure: 122 / 82  (06/21/2010)   Serum creatinine: Not documented   Serum potassium Not documented  Self-Management Support :    Hypertension self-management support: Not documented

## 2010-07-25 NOTE — Progress Notes (Signed)
  Phone Note Refill Request Call back at [phone]   Refills Requested: Medication #1:  LOPRESSOR 100 MG TABS one tab two times a day Initial call taken by: Abundio Miu,  July 18, 2010 4:52 PM  Follow-up for Phone Call        advised patient that MD sent in new Rx on 06/21/2010 , she called in old RX number . she will call pharmacy to ask about refill MD sent in on 06/21/2010 Follow-up by: Theresia Lo RN,  July 18, 2010 4:58 PM

## 2010-08-27 LAB — CBC
HCT: 22.7 % — ABNORMAL LOW (ref 36.0–46.0)
HCT: 23.3 % — ABNORMAL LOW (ref 36.0–46.0)
HCT: 24.2 % — ABNORMAL LOW (ref 36.0–46.0)
HCT: 26.1 % — ABNORMAL LOW (ref 36.0–46.0)
HCT: 29 % — ABNORMAL LOW (ref 36.0–46.0)
Hemoglobin: 10 g/dL — ABNORMAL LOW (ref 12.0–15.0)
Hemoglobin: 7.9 g/dL — ABNORMAL LOW (ref 12.0–15.0)
MCH: 31.5 pg (ref 26.0–34.0)
MCHC: 33.5 g/dL (ref 30.0–36.0)
MCHC: 34.3 g/dL (ref 30.0–36.0)
MCHC: 34.5 g/dL (ref 30.0–36.0)
MCV: 91.5 fL (ref 78.0–100.0)
MCV: 92.8 fL (ref 78.0–100.0)
MCV: 93 fL (ref 78.0–100.0)
MCV: 96.8 fL (ref 78.0–100.0)
MCV: 98.1 fL (ref 78.0–100.0)
Platelets: 105 10*3/uL — ABNORMAL LOW (ref 150–400)
Platelets: 38 10*3/uL — ABNORMAL LOW (ref 150–400)
Platelets: 39 10*3/uL — ABNORMAL LOW (ref 150–400)
RBC: 2.44 MIL/uL — ABNORMAL LOW (ref 3.87–5.11)
RBC: 2.5 MIL/uL — ABNORMAL LOW (ref 3.87–5.11)
RBC: 3.17 MIL/uL — ABNORMAL LOW (ref 3.87–5.11)
RDW: 17.5 % — ABNORMAL HIGH (ref 11.5–15.5)
RDW: 18 % — ABNORMAL HIGH (ref 11.5–15.5)
RDW: 18.5 % — ABNORMAL HIGH (ref 11.5–15.5)
RDW: 19 % — ABNORMAL HIGH (ref 11.5–15.5)
RDW: 19.5 % — ABNORMAL HIGH (ref 11.5–15.5)
WBC: 11.8 10*3/uL — ABNORMAL HIGH (ref 4.0–10.5)
WBC: 11.9 10*3/uL — ABNORMAL HIGH (ref 4.0–10.5)
WBC: 12.2 10*3/uL — ABNORMAL HIGH (ref 4.0–10.5)
WBC: 13.2 10*3/uL — ABNORMAL HIGH (ref 4.0–10.5)
WBC: 9.4 10*3/uL (ref 4.0–10.5)

## 2010-08-27 LAB — URINALYSIS, ROUTINE W REFLEX MICROSCOPIC
Bilirubin Urine: NEGATIVE
Glucose, UA: 100 mg/dL — AB
Ketones, ur: NEGATIVE mg/dL
Leukocytes, UA: NEGATIVE
Nitrite: NEGATIVE
Protein, ur: >300 mg/dL — AB
Specific Gravity, Urine: 1.012 (ref 1.005–1.030)
Urobilinogen, UA: 0.2 mg/dL (ref 0.0–1.0)
pH: 6.5 (ref 5.0–8.0)

## 2010-08-27 LAB — FRACTIONAL EXCRET-NA(24HR UR +SERUM)
Fractional Excretion of Na: 1 %
Sodium: 134 mEq/L — ABNORMAL LOW (ref 135–145)

## 2010-08-27 LAB — BASIC METABOLIC PANEL
BUN: 26 mg/dL — ABNORMAL HIGH (ref 6–23)
BUN: 28 mg/dL — ABNORMAL HIGH (ref 6–23)
BUN: 32 mg/dL — ABNORMAL HIGH (ref 6–23)
BUN: 34 mg/dL — ABNORMAL HIGH (ref 6–23)
CO2: 27 mEq/L (ref 19–32)
Calcium: 8.3 mg/dL — ABNORMAL LOW (ref 8.4–10.5)
Calcium: 8.9 mg/dL (ref 8.4–10.5)
Chloride: 100 mEq/L (ref 96–112)
Chloride: 105 mEq/L (ref 96–112)
Chloride: 107 mEq/L (ref 96–112)
Chloride: 95 mEq/L — ABNORMAL LOW (ref 96–112)
Chloride: 97 mEq/L (ref 96–112)
Creatinine, Ser: 2.66 mg/dL — ABNORMAL HIGH (ref 0.4–1.2)
Creatinine, Ser: 2.8 mg/dL — ABNORMAL HIGH (ref 0.4–1.2)
Creatinine, Ser: 2.87 mg/dL — ABNORMAL HIGH (ref 0.4–1.2)
GFR calc Af Amer: 22 mL/min — ABNORMAL LOW (ref >60)
GFR calc Af Amer: 23 mL/min — ABNORMAL LOW (ref >60)
GFR calc Af Amer: 24 mL/min — ABNORMAL LOW (ref >60)
GFR calc Af Amer: 26 mL/min — ABNORMAL LOW (ref >60)
GFR calc non Af Amer: 18 mL/min — ABNORMAL LOW (ref >60)
GFR calc non Af Amer: 20 mL/min — ABNORMAL LOW (ref >60)
GFR calc non Af Amer: 20 mL/min — ABNORMAL LOW (ref >60)
GFR calc non Af Amer: 22 mL/min — ABNORMAL LOW (ref >60)
Glucose, Bld: 117 mg/dL — ABNORMAL HIGH (ref 70–99)
Glucose, Bld: 95 mg/dL (ref 70–99)
Potassium: 2.8 mEq/L — ABNORMAL LOW (ref 3.5–5.1)
Potassium: 2.9 mEq/L — ABNORMAL LOW (ref 3.5–5.1)
Potassium: 3 mEq/L — ABNORMAL LOW (ref 3.5–5.1)
Potassium: 4.5 mEq/L (ref 3.5–5.1)
Potassium: 4.7 mEq/L (ref 3.5–5.1)
Sodium: 131 mEq/L — ABNORMAL LOW (ref 135–145)
Sodium: 132 mEq/L — ABNORMAL LOW (ref 135–145)
Sodium: 134 mEq/L — ABNORMAL LOW (ref 135–145)

## 2010-08-27 LAB — CREATININE CLEARANCE, URINE, 24 HOUR
Collection Interval-CRCL: 24 hours
Creatinine Clearance: 28 mL/min — ABNORMAL LOW (ref 75–115)
Creatinine, 24H Ur: 1089 mg/d (ref 700–1800)

## 2010-08-27 LAB — TROPONIN I
Troponin I: 0.25 ng/mL — ABNORMAL HIGH (ref 0.00–0.06)
Troponin I: 0.34 ng/mL — ABNORMAL HIGH (ref 0.00–0.06)

## 2010-08-27 LAB — URINE MICROSCOPIC-ADD ON

## 2010-08-27 LAB — UIFE/LIGHT CHAINS/TP QN, 24-HR UR
Albumin, U: DETECTED
Alpha 1, Urine: DETECTED — AB
Alpha 2, Urine: DETECTED — AB
Beta, Urine: DETECTED — AB
Free Kappa Lt Chains,Ur: 22.9 mg/dL — ABNORMAL HIGH (ref 0.04–1.51)
Free Kappa/Lambda Ratio: 4.4 ratio — ABNORMAL HIGH (ref 0.46–4.00)
Free Lambda Excretion/Day: 117 mg/d
Free Lambda Lt Chains,Ur: 5.2 mg/dL — ABNORMAL HIGH (ref 0.08–1.01)
Free Lt Chn Excr Rate: 515.25 mg/d
Gamma Globulin, Urine: DETECTED — AB
Time: 24 hours
Total Protein, Urine-Ur/day: 1269 mg/d — ABNORMAL HIGH (ref 10–140)
Total Protein, Urine: 56.4 mg/dL
Volume, Urine: 2250 mL

## 2010-08-27 LAB — URINE CULTURE
Colony Count: 50000
Culture  Setup Time: 201112270046

## 2010-08-27 LAB — COMPREHENSIVE METABOLIC PANEL
ALT: 14 U/L (ref 0–35)
AST: 58 U/L — ABNORMAL HIGH (ref 0–37)
Albumin: 3.2 g/dL — ABNORMAL LOW (ref 3.5–5.2)
Alkaline Phosphatase: 48 U/L (ref 39–117)
BUN: 24 mg/dL — ABNORMAL HIGH (ref 6–23)
CO2: 33 mEq/L — ABNORMAL HIGH (ref 19–32)
Calcium: 8.6 mg/dL (ref 8.4–10.5)
Chloride: 88 mEq/L — ABNORMAL LOW (ref 96–112)
Creatinine, Ser: 2.54 mg/dL — ABNORMAL HIGH (ref 0.4–1.2)
GFR calc Af Amer: 25 mL/min — ABNORMAL LOW (ref >60)
GFR calc non Af Amer: 21 mL/min — ABNORMAL LOW (ref >60)
Glucose, Bld: 99 mg/dL (ref 70–99)
Potassium: <2 mEq/L — CL (ref 3.5–5.1)
Sodium: 130 mEq/L — ABNORMAL LOW (ref 135–145)
Total Bilirubin: 1.2 mg/dL (ref 0.3–1.2)
Total Protein: 6.4 g/dL (ref 6.0–8.3)

## 2010-08-27 LAB — GLUCOSE, CAPILLARY

## 2010-08-27 LAB — CK TOTAL AND CKMB (NOT AT ARMC)
CK, MB: 4.1 ng/mL — ABNORMAL HIGH (ref 0.3–4.0)
CK, MB: 4.3 ng/mL — ABNORMAL HIGH (ref 0.3–4.0)
Relative Index: 4.1 — ABNORMAL HIGH (ref 0.0–2.5)
Relative Index: INVALID (ref 0.0–2.5)
Total CK: 104 U/L (ref 7–177)
Total CK: 83 U/L (ref 7–177)

## 2010-08-27 LAB — DIC (DISSEMINATED INTRAVASCULAR COAGULATION)PANEL
D-Dimer, Quant: 3.32 ug/mL-FEU — ABNORMAL HIGH (ref 0.00–0.48)
Fibrinogen: 314 mg/dL (ref 204–475)
INR: 1.07 (ref 0.00–1.49)
Prothrombin Time: 14.1 seconds (ref 11.6–15.2)
aPTT: 27 seconds (ref 24–37)

## 2010-08-27 LAB — PROTIME-INR
INR: 1.02 (ref 0.00–1.49)
Prothrombin Time: 13.6 seconds (ref 11.6–15.2)

## 2010-08-27 LAB — CARDIAC PANEL(CRET KIN+CKTOT+MB+TROPI)
CK, MB: 4.1 ng/mL — ABNORMAL HIGH (ref 0.3–4.0)
Relative Index: INVALID (ref 0.0–2.5)
Troponin I: 0.29 ng/mL — ABNORMAL HIGH (ref 0.00–0.06)

## 2010-08-27 LAB — ETHANOL: Alcohol, Ethyl (B): <5 mg/dL (ref 0–10)

## 2010-08-27 LAB — DIFFERENTIAL
Basophils Absolute: 0 10*3/uL (ref 0.0–0.1)
Basophils Relative: 0 % (ref 0–1)
Eosinophils Absolute: 0.1 10*3/uL (ref 0.0–0.7)
Eosinophils Relative: 1 % (ref 0–5)
Lymphocytes Relative: 11 % — ABNORMAL LOW (ref 12–46)
Lymphs Abs: 1.5 10*3/uL (ref 0.7–4.0)
Monocytes Absolute: 1.1 10*3/uL — ABNORMAL HIGH (ref 0.1–1.0)
Monocytes Relative: 8 % (ref 3–12)
Neutro Abs: 10.5 10*3/uL — ABNORMAL HIGH (ref 1.7–7.7)
Neutrophils Relative %: 79 % — ABNORMAL HIGH (ref 43–77)

## 2010-08-27 LAB — IRON AND TIBC
Iron: 55 ug/dL (ref 42–135)
Saturation Ratios: 20 % (ref 20–55)
TIBC: 275 ug/dL (ref 250–470)
UIBC: 220 ug/dL

## 2010-08-27 LAB — SAVE SMEAR

## 2010-08-27 LAB — PATHOLOGIST SMEAR REVIEW

## 2010-08-27 LAB — RAPID URINE DRUG SCREEN, HOSP PERFORMED
Amphetamines: NOT DETECTED
Barbiturates: NOT DETECTED
Benzodiazepines: NOT DETECTED
Cocaine: NOT DETECTED
Opiates: NOT DETECTED
Tetrahydrocannabinol: NOT DETECTED

## 2010-08-27 LAB — CULTURE, BLOOD (ROUTINE X 2)
Culture  Setup Time: 201112270422
Culture: NO GROWTH
Culture: NO GROWTH

## 2010-08-27 LAB — MRSA PCR SCREENING: MRSA by PCR: NEGATIVE

## 2010-08-27 LAB — LACTATE DEHYDROGENASE: LDH: 696 U/L — ABNORMAL HIGH (ref 94–250)

## 2010-08-27 LAB — TECHNOLOGIST SMEAR REVIEW

## 2010-08-27 LAB — FERRITIN: Ferritin: 89 ng/mL (ref 10–291)

## 2010-08-27 LAB — TSH: TSH: 1.158 u[IU]/mL (ref 0.350–4.500)

## 2010-08-27 LAB — MAGNESIUM: Magnesium: 1.8 mg/dL (ref 1.5–2.5)

## 2010-08-27 LAB — LIPID PANEL
Cholesterol: 154 mg/dL (ref 0–200)
LDL Cholesterol: 91 mg/dL (ref 0–99)

## 2010-08-27 LAB — HAPTOGLOBIN: Haptoglobin: <6 mg/dL — ABNORMAL LOW (ref 16–200)

## 2010-08-27 LAB — PREGNANCY, URINE: Preg Test, Ur: NEGATIVE

## 2010-08-27 LAB — ALDOSTERONE, URINE, 24 HOUR: Volume, Urine-ALDU: 2250 mL

## 2010-08-27 LAB — OSMOLALITY: Osmolality: 278 mOsm/kg (ref 275–300)

## 2010-08-27 LAB — BRAIN NATRIURETIC PEPTIDE: Pro B Natriuretic peptide (BNP): >3200 pg/mL — ABNORMAL HIGH (ref 0.0–100.0)

## 2010-08-27 LAB — DIRECT ANTIGLOBULIN TEST (NOT AT ARMC): DAT, complement: NEGATIVE

## 2010-09-27 LAB — CBC
HCT: 36 % (ref 36.0–46.0)
Hemoglobin: 11.9 g/dL — ABNORMAL LOW (ref 12.0–15.0)
MCHC: 33.1 g/dL (ref 30.0–36.0)
MCV: 92.9 fL (ref 78.0–100.0)
Platelets: 230 10*3/uL (ref 150–400)
RBC: 3.87 MIL/uL (ref 3.87–5.11)
RDW: 15.3 % (ref 11.5–15.5)
WBC: 7.2 10*3/uL (ref 4.0–10.5)

## 2010-09-27 LAB — BASIC METABOLIC PANEL
BUN: 11 mg/dL (ref 6–23)
Calcium: 9.1 mg/dL (ref 8.4–10.5)
Creatinine, Ser: 0.88 mg/dL (ref 0.4–1.2)
GFR calc non Af Amer: >60 mL/min (ref >60)

## 2010-09-27 LAB — BASIC METABOLIC PANEL WITH GFR
CO2: 25 meq/L (ref 19–32)
Chloride: 108 meq/L (ref 96–112)
GFR calc Af Amer: >60 mL/min (ref >60)
Glucose, Bld: 91 mg/dL (ref 70–99)
Potassium: 3.7 meq/L (ref 3.5–5.1)
Sodium: 137 meq/L (ref 135–145)

## 2010-09-27 LAB — URINALYSIS, ROUTINE W REFLEX MICROSCOPIC
Glucose, UA: 100 mg/dL — AB
Specific Gravity, Urine: 1.026 (ref 1.005–1.030)
pH: 6 (ref 5.0–8.0)

## 2010-09-27 LAB — URINE MICROSCOPIC-ADD ON

## 2010-11-02 NOTE — Group Therapy Note (Signed)
[name], Mary Pitts NO.:  [id number]   MEDICAL RECORD NO.:  [id number]                   PATIENT TYPE:  OUT   LOCATION:  [facility]                           FACILITY:  [facility]   PHYSICIAN:  Argentina Donovan, MD                     DATE OF BIRTH:  December 25, 1970   DATE OF SERVICE:  05/10/2003                                    CLINIC NOTE   HISTORY OF PRESENT ILLNESS:  The patient is a 40 year old black female para  0-0-0-0 who has had irregular periods since she has been a teenager and had  never been able to take oral contraceptives because of breakthrough  bleeding.  She was seen in the MAU with a history of heavy bleeding in  October but this is not backed up by her hemoglobin which has been normal.  At that time she had a very high blood pressure and was referred to the  medical clinic and was placed on benazepril 40 mg daily and  hydrochlorothiazide 25 mg daily.  Examining the patient the abdomen is soft,  flat, nontender.  No mass or organomegaly, but a female escutcheon on the  abdomen.  Marked other signs of hirsutism.  She only weighs 137 pounds and  has no acne.  External genitalia is normal.  BUS within normal limits.  Clitoris is not hypertrophied.  Heavy hair on the legs as well as the  abdomen and the face.  The vagina is clean and well rugated.  The cervix is  nulliparous and clean.  Pap smear was taken.  The uterus is anterior, normal  size, shape, consistency.  Adnexa was normal.   IMPRESSION:  Polycystic ovarian syndrome.  Will get an FSH, LH, prolactin,  testosterone levels as well as a TSH.  We are placing the patient on Ovcon  and also on Aldactone.  We are going to have her on 50 mg b.i.d.  We are  going to recheck her in a couple weeks to make sure we are not over treating  her hypertension as the blood pressure at this time is 146/94 which is a  considerable change when she was in the MAU with a blood pressure of  165/103.   IMPRESSION:   Dysfunctional uterine bleeding with long episodes of amenorrhea  and polycystic ovarian syndrome and hirsutism.                                               Argentina Donovan, MD    PR/MEDQ  D:  05/10/2003  T:  05/10/2003  Job:  [id number]

## 2010-11-12 ENCOUNTER — Other Ambulatory Visit: Payer: Self-pay | Admitting: Family Medicine

## 2010-11-19 ENCOUNTER — Other Ambulatory Visit: Payer: Self-pay | Admitting: *Deleted

## 2010-11-19 DIAGNOSIS — I10 Essential (primary) hypertension: Secondary | ICD-10-CM

## 2010-11-19 MED ORDER — HYDROCHLOROTHIAZIDE 25 MG PO TABS: 25.0000 mg | ORAL_TABLET | Freq: Every day | ORAL | Status: DC

## 2011-01-03 ENCOUNTER — Other Ambulatory Visit: Payer: Self-pay | Admitting: Family Medicine

## 2011-01-03 NOTE — Telephone Encounter (Signed)
refill request

## 2011-02-26 ENCOUNTER — Other Ambulatory Visit: Payer: Self-pay | Admitting: Family Medicine

## 2011-02-26 NOTE — Telephone Encounter (Signed)
Refill request

## 2011-07-17 ENCOUNTER — Other Ambulatory Visit: Payer: Self-pay | Admitting: Family Medicine

## 2011-07-17 NOTE — Telephone Encounter (Signed)
Refill request

## 2011-09-16 DIAGNOSIS — M2740 Unspecified cyst of jaw: Secondary | ICD-10-CM

## 2011-09-16 HISTORY — DX: Unspecified cyst of jaw: M27.40

## 2011-10-04 ENCOUNTER — Encounter: Payer: Self-pay | Admitting: Family Medicine

## 2011-10-04 ENCOUNTER — Ambulatory Visit (INDEPENDENT_AMBULATORY_CARE_PROVIDER_SITE_OTHER): Payer: Self-pay | Admitting: Family Medicine

## 2011-10-04 VITALS — BP 164/112 | HR 80 | Temp 98.3°F | Ht 63.0 in | Wt 141.0 lb

## 2011-10-04 DIAGNOSIS — D638 Anemia in other chronic diseases classified elsewhere: Secondary | ICD-10-CM

## 2011-10-04 DIAGNOSIS — N189 Chronic kidney disease, unspecified: Secondary | ICD-10-CM | POA: Insufficient documentation

## 2011-10-04 DIAGNOSIS — I10 Essential (primary) hypertension: Secondary | ICD-10-CM

## 2011-10-04 MED ORDER — AMLODIPINE BESYLATE 5 MG PO TABS: 5.0000 mg | ORAL_TABLET | Freq: Every day | ORAL | Status: DC

## 2011-10-04 NOTE — Assessment & Plan Note (Signed)
Poorly controlled- will add back amlodipine and follow-up in 1 week

## 2011-10-04 NOTE — Assessment & Plan Note (Addendum)
Will recheck cr/K today

## 2011-10-04 NOTE — Assessment & Plan Note (Signed)
Will check CBC,ferritin.  History of heavy periods and anemia of chronic disease

## 2011-10-04 NOTE — Patient Instructions (Signed)
Will check bloodwork for anemia, cholesterol, kidney function, thyroid  Continue HCTZ, Lisinopril, and metoprolol  Add back Norvasc 5 mg- try right before bed   Check your blood pressures and write down  Follow-up next Friday to discuss blood pressure

## 2011-10-04 NOTE — Progress Notes (Signed)
  Subjective:    Patient ID: Mary Pitts, female    DOB: 1971/02/07, 41 y.o.   MRN: [id number]  HPI Here for follow-up hypertension- has not been here in over a year  Was at dentists this week, they canceled tooth extraction due to profound hypertension- patient states systolic over 200.  Had been intermittently taking medications.  Has been on lisinopril 5, metoprolol 100 bid, and HCTZ 25 for the past day.  Had previously been on amlodipine she self d/c it as she was taking it at noon and felt it made her dizzy  No dyspnea, chest pain  I have reviewed patient's  PMH, FH, and Social history and Medications as related to this visit. History of poorly controlled hypertension  Strong family history of hypertension and early death  Review of Systems See HPI    Objective:   Physical Exam  GEN: Alert & Oriented, No acute distress, here with boyfriend CV:  Regular Rate & Rhythm, no murmur Respiratory:  Normal work of breathing, CTAB Ext: no pre-tibial edema       Assessment & Plan:

## 2011-10-05 LAB — COMPREHENSIVE METABOLIC PANEL
Alkaline Phosphatase: 59 U/L (ref 39–117)
BUN: 13 mg/dL (ref 6–23)
CO2: 24 mEq/L (ref 19–32)
Creat: 1.4 mg/dL — ABNORMAL HIGH (ref 0.50–1.10)
Glucose, Bld: 112 mg/dL — ABNORMAL HIGH (ref 70–99)
Total Bilirubin: 0.5 mg/dL (ref 0.3–1.2)

## 2011-10-05 LAB — CBC
HCT: 37 % (ref 36.0–46.0)
Hemoglobin: 12 g/dL (ref 12.0–15.0)
RDW: 16.7 % — ABNORMAL HIGH (ref 11.5–15.5)
WBC: 10.6 10*3/uL — ABNORMAL HIGH (ref 4.0–10.5)

## 2011-10-05 LAB — ANEMIA PANEL
%SAT: 9 % — ABNORMAL LOW (ref 20–55)
Folate: 4.9 ng/mL
RBC.: 4.16 MIL/uL (ref 3.87–5.11)
TIBC: 415 ug/dL (ref 250–470)
UIBC: 376 ug/dL (ref 125–400)
Vitamin B-12: 660 pg/mL (ref 211–911)

## 2011-10-05 LAB — LIPID PANEL
Cholesterol: 172 mg/dL (ref 0–200)
HDL: 49 mg/dL (ref >39)
Total CHOL/HDL Ratio: 3.5 Ratio
Triglycerides: 89 mg/dL (ref <150)
VLDL: 18 mg/dL (ref 0–40)

## 2011-10-05 LAB — TSH: TSH: 0.67 u[IU]/mL (ref 0.350–4.500)

## 2011-10-11 ENCOUNTER — Ambulatory Visit (INDEPENDENT_AMBULATORY_CARE_PROVIDER_SITE_OTHER): Payer: BC Managed Care – PPO | Admitting: Family Medicine

## 2011-10-11 ENCOUNTER — Encounter: Payer: Self-pay | Admitting: Family Medicine

## 2011-10-11 VITALS — BP 138/92 | HR 76 | Temp 97.6°F | Ht 63.0 in | Wt 138.5 lb

## 2011-10-11 DIAGNOSIS — D638 Anemia in other chronic diseases classified elsewhere: Secondary | ICD-10-CM

## 2011-10-11 DIAGNOSIS — I10 Essential (primary) hypertension: Secondary | ICD-10-CM

## 2011-10-11 DIAGNOSIS — R7301 Impaired fasting glucose: Secondary | ICD-10-CM

## 2011-10-11 DIAGNOSIS — F17200 Nicotine dependence, unspecified, uncomplicated: Secondary | ICD-10-CM

## 2011-10-11 MED ORDER — LISINOPRIL 10 MG PO TABS: 10.0000 mg | ORAL_TABLET | Freq: Every day | ORAL | Status: DC

## 2011-10-11 NOTE — Progress Notes (Signed)
  Subjective:    Patient ID: Mary Pitts, female    DOB: 08/23/1970, 41 y.o.   MRN: [id number]  HPIHere to follow-up hypertension  HYPERTENSION- at last vist started amlodipine  BP Readings from Last 3 Encounters:  10/11/11 138/92  10/04/11 164/112  07/06/10 128/85    Hypertension ROS: taking medications as instructed, no medication side effects noted, no chest pain on exertion, no dyspnea on exertion and no swelling of ankles.   We reviewed labs regarding CKD, elevated fasting glucose, anemia       Review of Systems See HPI    Objective:   Physical Exam GEN: Alert & Oriented, No acute distress CV:  Regular Rate & Rhythm, no murmur Respiratory:  Normal work of breathing, CTAB Abd:  + BS, soft, no tenderness to palpation Ext: no pre-tibial edema       Assessment & Plan:

## 2011-10-11 NOTE — Patient Instructions (Addendum)
Will get a1c to check for diabetes  Will increase lisinopril from 5 to 10 mg  Get blood drawn in 7-10 days  Your blood pressure goal is < 130/80  Follow-up with Dr. Armen Pickup 3-4 weeks to talk about blood pressure and other concerns

## 2011-10-11 NOTE — Assessment & Plan Note (Signed)
She is working on cutting back- does not desire any additional help at this time

## 2011-10-11 NOTE — Assessment & Plan Note (Signed)
Mildly low iron- suggestion women's one a day supplement daily

## 2011-10-11 NOTE — Assessment & Plan Note (Signed)
a1c well within norm today.  Monitor annually

## 2011-10-11 NOTE — Assessment & Plan Note (Signed)
Improved, but not at goal.  Will increase lisinopril 5 to 10 mg daily. Reviewed with patient teratogenicity.  No contraception, sexually active- She states she has had unprotected sex for 20 years and is willing to take risk.  She will think more at home and discuss with PCP

## 2011-10-15 ENCOUNTER — Other Ambulatory Visit: Payer: Self-pay | Admitting: Family Medicine

## 2011-10-21 ENCOUNTER — Other Ambulatory Visit: Payer: BC Managed Care – PPO

## 2011-10-21 DIAGNOSIS — I10 Essential (primary) hypertension: Secondary | ICD-10-CM

## 2011-10-21 LAB — BASIC METABOLIC PANEL
BUN: 18 mg/dL (ref 6–23)
Creat: 1.29 mg/dL — ABNORMAL HIGH (ref 0.50–1.10)
Glucose, Bld: 95 mg/dL (ref 70–99)
Potassium: 3.8 mEq/L (ref 3.5–5.3)

## 2011-10-21 NOTE — Progress Notes (Signed)
BMP DONE TODAY Mary Pitts 

## 2011-10-22 ENCOUNTER — Encounter: Payer: Self-pay | Admitting: Family Medicine

## 2011-11-07 ENCOUNTER — Telehealth: Payer: Self-pay | Admitting: *Deleted

## 2011-11-07 ENCOUNTER — Ambulatory Visit (INDEPENDENT_AMBULATORY_CARE_PROVIDER_SITE_OTHER): Payer: BC Managed Care – PPO | Admitting: Family Medicine

## 2011-11-07 ENCOUNTER — Encounter: Payer: Self-pay | Admitting: Family Medicine

## 2011-11-07 VITALS — BP 131/85 | HR 69 | Temp 97.9°F | Ht 63.0 in | Wt 136.0 lb

## 2011-11-07 DIAGNOSIS — F17200 Nicotine dependence, unspecified, uncomplicated: Secondary | ICD-10-CM

## 2011-11-07 DIAGNOSIS — I10 Essential (primary) hypertension: Secondary | ICD-10-CM

## 2011-11-07 MED ORDER — METOPROLOL TARTRATE 100 MG PO TABS: 100.0000 mg | ORAL_TABLET | Freq: Two times a day (BID) | ORAL | Status: DC

## 2011-11-07 MED ORDER — NICOTINE POLACRILEX 4 MG MT GUM: 4.0000 mg | CHEWING_GUM | OROMUCOSAL | Status: AC | PRN

## 2011-11-07 MED ORDER — AMLODIPINE BESYLATE 5 MG PO TABS: 5.0000 mg | ORAL_TABLET | Freq: Every day | ORAL | Status: DC

## 2011-11-07 MED ORDER — LISINOPRIL 10 MG PO TABS: 10.0000 mg | ORAL_TABLET | Freq: Every day | ORAL | Status: DC

## 2011-11-07 NOTE — Assessment & Plan Note (Signed)
A: 1/2 PPD x 20 years. Contemplating quitting. Barrier to quitting is work related stress. P: -stress coping with exercise -nicorette for nicotine replacement as patient slowly decreases smoking.

## 2011-11-07 NOTE — Patient Instructions (Addendum)
Mary Pitts,  Thank you very much for coming in to see me today.  Please keep the following in mind: -decrease smoking with gum/other supplements -regular walking/exercise -low salt (no extra salt, limit fast food) -enjoy plenty of fresh veggies this summer  Please schedule a physical for sometime this summer for repeat pap smear, mammogram referral, full physical.   Dr. Armen Pickup

## 2011-11-07 NOTE — Progress Notes (Signed)
Patient ID: Mary Pitts, female   DOB: [birth year], 41 y.o.   MRN: [id number] Subjective:  Mary Pitts is a 41 y.o. female with hypertension. She was seen last month and started on lisinopril 10 mg daily, Cr checked and stable. She continues to smoke 1/2 PPD x 20 years.   Current Outpatient Prescriptions  Medication Sig Dispense Refill  . amLODipine (NORVASC) 5 MG tablet Take 1 tablet (5 mg total) by mouth daily.  30 tablet  11  . aspirin 81 MG tablet Take 81 mg by mouth daily.      . hydrochlorothiazide (HYDRODIURIL) 25 MG tablet TAKE 1 TABLET (25 MG TOTAL) BY MOUTH DAILY.  30 tablet  11  . lisinopril (PRINIVIL,ZESTRIL) 10 MG tablet Take 1 tablet (10 mg total) by mouth daily.  30 tablet  11  . metoprolol (LOPRESSOR) 100 MG tablet Take 1 tablet (100 mg total) by mouth 2 (two) times daily.  60 tablet  11  . nicotine polacrilex (NICORETTE) 4 MG gum Take 1 each (4 mg total) by mouth as needed for smoking cessation.  100 tablet  6  . DISCONTD: amLODipine (NORVASC) 5 MG tablet Take 1 tablet (5 mg total) by mouth daily.  30 tablet  0  . DISCONTD: lisinopril (PRINIVIL,ZESTRIL) 10 MG tablet Take 1 tablet (10 mg total) by mouth daily.  30 tablet  0  . DISCONTD: metoprolol (LOPRESSOR) 100 MG tablet TAKE 1 TABLET BY MOUTH TWICE A DAY  60 tablet  3  . ibuprofen (ADVIL,MOTRIN) 800 MG tablet Take 800 mg by mouth every 6 (six) hours as needed. Take every 6 to 8 hours prn.   Tammy Ingram Artis dds        Hypertension ROS: taking medications as instructed, no medication side effects noted, no chest pain on exertion, no dyspnea on exertion and no swelling of ankles.  New concerns: none.   Objective:  BP 131/85  Pulse 69  Temp(Src) 97.9 F (36.6 C) (Oral)  Ht 5\' 3"  (1.6 m)  Wt 136 lb (61.689 kg)  BMI 24.09 kg/m2  LMP 09/02/2011  Appearance alert, well appearing, and in no distress. General exam BP noted to be well controlled today in office, S1, S2 normal, no gallop, no murmur, chest clear, no JVD,  no HSM, no edema.  Lab review: labs are reviewed, up to date and normal.  Lab Results  Component Value Date   CREATININE 1.29* 10/21/2011    Assessment:   Hypertension improved and asymptomatic.  Med: compliant no side effects.   Plan:  Smoking cesssation discussed patient in contemplative stage. She agrees to try nicotine gum.  Limit NSAIDs to as little as possible. Daily aerobic exercise. Potassium rich foods encouraged.  Limit fast foods/heavily processed foods.

## 2011-11-07 NOTE — Assessment & Plan Note (Signed)
Assessment:   Hypertension improved and asymptomatic.  Med: compliant no side effects.  Plan:  Smoking cesssation discussed patient in contemplative stage. She agrees to try nicotine gum.  Limit NSAIDs to as little as possible. Daily aerobic exercise. Potassium rich foods encouraged.  Limit fast foods/heavily processed foods.

## 2011-11-07 NOTE — Telephone Encounter (Signed)
Received notice from [facility] that [facility] requires PA. I called insurance and was told that patient needs to contact the insurance to sign up with Scripts Program  if she wishes . Then she needs to contact pharmacy to let them know she has done this. Then insurance will issue an override. Patient notified and she voices understanding.

## 2011-11-28 NOTE — Telephone Encounter (Signed)
Received PA request for Nicotine gum again . Called  insurance and was told  this does not required PA . However patient has to call insurance company and  either enroll or decline to enroll in [employer]. After they have this information then they can dispense medication.  Tried to call patient , phone not in service at this time . Called and told pharmacist to notify patient.

## 2011-12-12 ENCOUNTER — Ambulatory Visit (INDEPENDENT_AMBULATORY_CARE_PROVIDER_SITE_OTHER): Payer: BC Managed Care – PPO | Admitting: Family Medicine

## 2011-12-12 ENCOUNTER — Other Ambulatory Visit (HOSPITAL_COMMUNITY)
Admission: RE | Admit: 2011-12-12 | Discharge: 2011-12-12 | Disposition: A | Discharge status: Home / Self Care | Payer: BC Managed Care – PPO | Source: Ambulatory Visit | Attending: Family Medicine | Admitting: Family Medicine

## 2011-12-12 ENCOUNTER — Encounter: Payer: Self-pay | Admitting: Family Medicine

## 2011-12-12 VITALS — BP 126/72 | HR 70 | Temp 98.1°F | Ht 63.0 in | Wt 137.0 lb

## 2011-12-12 DIAGNOSIS — Z23 Encounter for immunization: Secondary | ICD-10-CM

## 2011-12-12 DIAGNOSIS — I10 Essential (primary) hypertension: Secondary | ICD-10-CM

## 2011-12-12 DIAGNOSIS — F17200 Nicotine dependence, unspecified, uncomplicated: Secondary | ICD-10-CM

## 2011-12-12 DIAGNOSIS — Z124 Encounter for screening for malignant neoplasm of cervix: Secondary | ICD-10-CM

## 2011-12-12 DIAGNOSIS — Z01419 Encounter for gynecological examination (general) (routine) without abnormal findings: Secondary | ICD-10-CM | POA: Insufficient documentation

## 2011-12-12 DIAGNOSIS — Z0000 Encounter for general adult medical examination without abnormal findings: Secondary | ICD-10-CM

## 2011-12-12 MED ORDER — METOPROLOL TARTRATE 100 MG PO TABS: 100.0000 mg | ORAL_TABLET | Freq: Two times a day (BID) | ORAL | Status: DC

## 2011-12-12 MED ORDER — HYDROCHLOROTHIAZIDE 25 MG PO TABS: 25.0000 mg | ORAL_TABLET | Freq: Every day | ORAL | Status: DC

## 2011-12-12 MED ORDER — AMLODIPINE BESYLATE 5 MG PO TABS: 5.0000 mg | ORAL_TABLET | Freq: Every day | ORAL | Status: DC

## 2011-12-12 MED ORDER — LISINOPRIL 10 MG PO TABS: 10.0000 mg | ORAL_TABLET | Freq: Every day | ORAL | Status: DC

## 2011-12-12 NOTE — Assessment & Plan Note (Signed)
A: Normal pelvic and breast exam.  P -pap done today.  -Referral form for mammogram given. -Tetanus shot administered.

## 2011-12-12 NOTE — Assessment & Plan Note (Signed)
A: improved Meds: compliant P: continue current regimen  

## 2011-12-12 NOTE — Progress Notes (Signed)
Subjective:     Patient ID: Mary Pitts, female   DOB: Oct 25, 1970, 41 y.o.   MRN: [id number]  HPI 41 yo F presents for a pap smear and  to discuss the following: 1. HTN: compliant with medications. Still smoking. Denies CP/SOB / LE edema. Not exercising. Is compliant with a low salt diet.   2. Smoker: current everyday smoker. 8 cigarettes per day down from 1PPD. Ready to quit. Has not yet set quit date.   3. Health maintenance: had abnormal pap as as teen with cryotherapy. Has not had mammogram. Does not do self breast exam.   Allergies: Review of patient's allergies indicates no known allergies.  Patient's last menstrual period was 11/29/2011.  OB History    Grav Para Term Preterm Abortions TAB SAB Ect Mult Living   0 0 0 0 0 0 0 0 0 0      Review of Systems Feeling well. No dyspnea or chest pain on exertion.  No abdominal pain, change in bowel habits, black or bloody stools.  No urinary tract symptoms. GYN ROS: normal menses, no abnormal bleeding, pelvic pain or discharge, she complains of being hot all the time and excessive facial and body hair. . No neurological complaints.    Objective:   Physical Exam The patient appears well, alert, oriented x 3, in no distress. BP 126/72  Pulse 70  Temp 98.1 F (36.7 C) (Oral)  Ht 5\' 3"  (1.6 m)  Wt 137 lb (62.143 kg)  BMI 24.27 kg/m2  LMP 11/29/2011 ENT normal.  Neck supple. No adenopathy or thyromegaly. PERLA. Lungs are clear, good air entry, no wheezes, rhonchi or rales. S1 and S2 normal, no murmurs, regular rate and rhythm. Abdomen soft without tenderness, guarding, mass or organomegaly. Extremities show no edema, normal peripheral pulses. Neurological is normal, no focal findings. SKIN: female pattern hair growth on chip, upper lip and chest.   BREAST EXAM: breasts appear normal, no suspicious masses, no skin or nipple changes or axillary nodes  PELVIC EXAM: normal external genitalia, vulva, vagina, cervix, uterus and adnexa, RECTAL:  external hemorrhoids non-thrombosed, normal sphincter tone  Assessment and Plan:

## 2011-12-12 NOTE — Assessment & Plan Note (Signed)
A: improved. Patient ready to quit. Unable to afford nicotine replacement. P: quit date scheduled for 01/05/12.

## 2011-12-12 NOTE — Patient Instructions (Signed)
Mary Pitts,  Thank you very much for coming in to see me today. Great job with cutting down on smoking. As we discussed quitting on your birthday is a wonderful present.   I will call or send a letter with your pap results. Please get your mammogram done.   Plan to f/u in 6 months for BP check up or sooner if needed.   Dr. Armen Pickup

## 2011-12-18 ENCOUNTER — Encounter: Payer: Self-pay | Admitting: *Deleted

## 2012-01-01 ENCOUNTER — Other Ambulatory Visit: Payer: Self-pay | Admitting: Family Medicine

## 2012-03-01 ENCOUNTER — Other Ambulatory Visit: Payer: Self-pay | Admitting: Family Medicine

## 2012-05-07 ENCOUNTER — Other Ambulatory Visit: Payer: Self-pay | Admitting: Family Medicine

## 2012-08-27 ENCOUNTER — Other Ambulatory Visit: Payer: Self-pay | Admitting: Family Medicine

## 2012-11-27 ENCOUNTER — Other Ambulatory Visit: Payer: Self-pay | Admitting: Family Medicine

## 2013-01-20 ENCOUNTER — Other Ambulatory Visit: Payer: Self-pay | Admitting: *Deleted

## 2013-01-20 DIAGNOSIS — I10 Essential (primary) hypertension: Secondary | ICD-10-CM

## 2013-01-21 MED ORDER — HYDROCHLOROTHIAZIDE 25 MG PO TABS: ORAL_TABLET | ORAL | Status: DC

## 2013-01-21 MED ORDER — LISINOPRIL 10 MG PO TABS: 10.0000 mg | ORAL_TABLET | Freq: Every day | ORAL | Status: DC

## 2013-05-01 ENCOUNTER — Other Ambulatory Visit: Payer: Self-pay | Admitting: Family Medicine

## 2013-05-03 ENCOUNTER — Other Ambulatory Visit: Payer: Self-pay | Admitting: Family Medicine

## 2013-10-14 ENCOUNTER — Other Ambulatory Visit: Payer: Self-pay | Admitting: *Deleted

## 2013-10-14 MED ORDER — METOPROLOL TARTRATE 100 MG PO TABS: ORAL_TABLET | ORAL | Status: DC

## 2014-03-30 ENCOUNTER — Other Ambulatory Visit: Payer: Self-pay | Admitting: Family Medicine

## 2014-04-05 ENCOUNTER — Other Ambulatory Visit: Payer: Self-pay | Admitting: *Deleted

## 2014-04-05 DIAGNOSIS — I10 Essential (primary) hypertension: Secondary | ICD-10-CM

## 2014-04-05 MED ORDER — LISINOPRIL 10 MG PO TABS: 10.0000 mg | ORAL_TABLET | Freq: Every day | ORAL | Status: DC

## 2014-04-18 ENCOUNTER — Other Ambulatory Visit: Payer: Self-pay | Admitting: *Deleted

## 2014-04-18 MED ORDER — METOPROLOL TARTRATE 100 MG PO TABS: ORAL_TABLET | ORAL | Status: DC

## 2014-04-27 ENCOUNTER — Other Ambulatory Visit: Payer: Self-pay | Admitting: *Deleted

## 2014-04-28 MED ORDER — HYDROCHLOROTHIAZIDE 25 MG PO TABS: ORAL_TABLET | ORAL | Status: DC

## 2014-07-04 ENCOUNTER — Other Ambulatory Visit: Payer: Self-pay | Admitting: *Deleted

## 2014-07-04 NOTE — Telephone Encounter (Signed)
Note to nursing staff - patient not seen in office since 2013, needs appointment prior to medication refill on Metoprolol, please schedule

## 2014-07-04 NOTE — Telephone Encounter (Signed)
[name] for pt to return call .Mary Pitts Dawn  

## 2014-07-29 ENCOUNTER — Encounter (HOSPITAL_COMMUNITY): Payer: Self-pay | Admitting: Emergency Medicine

## 2014-07-29 ENCOUNTER — Emergency Department (HOSPITAL_COMMUNITY)
Admission: EM | Admit: 2014-07-29 | Discharge: 2014-07-29 | Disposition: A | Discharge status: Home / Self Care | Payer: BLUE CROSS/BLUE SHIELD | Source: Home / Self Care | Attending: Family Medicine | Admitting: Family Medicine

## 2014-07-29 DIAGNOSIS — J069 Acute upper respiratory infection, unspecified: Secondary | ICD-10-CM

## 2014-07-29 MED ORDER — IPRATROPIUM BROMIDE 0.06 % NA SOLN: 2.0000 | Freq: Four times a day (QID) | NASAL | Status: AC

## 2014-07-29 MED ORDER — GUAIFENESIN-CODEINE 100-10 MG/5ML PO SYRP: 5.0000 mL | ORAL_SOLUTION | Freq: Four times a day (QID) | ORAL | Status: AC | PRN

## 2014-07-29 NOTE — ED Provider Notes (Signed)
CSN: [id number]     Arrival date & time 07/29/14  1033 History   None    Chief Complaint  Patient presents with  . Cough  . Nasal Congestion  . Fever   (Consider location/radiation/quality/duration/timing/severity/associated sxs/prior Treatment) HPI Comments: 44 year old female who developed cough, head congestion, runny nose, nasal stuffiness 3 days ago. 2 days ago she developed diarrhea. Her temperature today was 99.3. She has been taking NyQuil with modest relief of symptoms. This contains a decongestant and her blood pressure is running high today. Denies earache, sore throat, PND, chest pain, shortness of breath, abdominal pain, GU symptoms.   Past Medical History  Diagnosis Date  . Cyst of mandible 09/2011    Removed   . Hypertension   . Smoking    History reviewed. No pertinent past surgical history. Family History  Problem Relation Age of Onset  . Alcohol abuse Mother 5150    Passed  . Heart disease Father   . Cancer Maternal Aunt 57    unsure  . Heart disease Maternal Grandmother   . Heart disease Maternal Grandfather    History  Substance Use Topics  . Smoking status: Current Every Day Smoker -- 0.50 packs/day    Types: Cigarettes  . Smokeless tobacco: Former NeurosurgeonUser  . Alcohol Use: Yes     Comment: Socially    OB History    Gravida Para Term Preterm AB TAB SAB Ectopic Multiple Living   0 0 0 0 0 0 0 0 0 0      Review of Systems  All other systems reviewed and are negative.   Allergies  Review of patient's allergies indicates no known allergies.  Home Medications   Prior to Admission medications   Medication Sig Start Date End Date Taking? Authorizing Provider  hydrochlorothiazide (HYDRODIURIL) 25 MG tablet TAKE 1 TABLET (25 MG TOTAL) BY MOUTH DAILY. 04/28/14  Yes Charlane FerrettiMelanie C Marsh, MD  ibuprofen (ADVIL,MOTRIN) 800 MG tablet Take 800 mg by mouth every 6 (six) hours as needed. Take every 6 to 8 hours prn.   Tammy Leana GamerIngram Artis dds   Yes Historical Provider, MD   lisinopril (PRINIVIL,ZESTRIL) 10 MG tablet Take 1 tablet (10 mg total) by mouth daily. 04/05/14  Yes Charlane FerrettiMelanie C Marsh, MD  metoprolol (LOPRESSOR) 100 MG tablet TAKE 1 TABLET BY MOUTH TWICE A DAY 04/18/14  Yes Charlane FerrettiMelanie C Marsh, MD  amLODipine (NORVASC) 5 MG tablet TAKE 1 TABLET DAILY 05/07/12   Lora PaulaJosalyn C Funches, MD  aspirin 81 MG tablet Take 81 mg by mouth daily.    Historical Provider, MD  guaiFENesin-codeine (CHERATUSSIN AC) 100-10 MG/5ML syrup Take 5 mLs by mouth 4 (four) times daily as needed for cough or congestion. Take 5-10 mL every 4-6 hours when necessary cough 07/29/14   Hayden Rasmussenavid Norena Bratton, NP  ipratropium (ATROVENT) 0.06 % nasal spray Place 2 sprays into both nostrils 4 (four) times daily. 07/29/14   Hayden Rasmussenavid Elvin Banker, NP   BP 176/130 mmHg  Pulse 75  Temp(Src) 99.3 F (37.4 C) (Oral)  Resp 16  SpO2 98%  LMP 07/17/2014 (Approximate) Physical Exam  Constitutional: She is oriented to person, place, and time. She appears well-developed and well-nourished. No distress.  HENT:  Left TM is normal. Right TM is obscured by wax Oropharynx with mild injection. No diffuse erythema or exudates.  Eyes: Conjunctivae and EOM are normal.  Neck: Normal range of motion. Neck supple.  Cardiovascular: Normal rate and regular rhythm.   Murmur heard. Pulmonary/Chest: Effort normal and breath sounds  normal. No respiratory distress. She has no wheezes. She has no rales.  Musculoskeletal: Normal range of motion. She exhibits no edema.  Lymphadenopathy:    She has no cervical adenopathy.  Neurological: She is alert and oriented to person, place, and time.  Skin: Skin is warm and dry. No rash noted.  Psychiatric: She has a normal mood and affect.  Nursing note and vitals reviewed.   ED Course  Procedures (including critical care time) Labs Review Labs Reviewed - No data to display  Imaging Review No results found.   MDM   1. URI (upper respiratory infection)    Nondrowsy antihistamine such as Allegra or  Claritin or Zyrtec Nasal saline often and may also use humidifier.. Atrovent nasal spray for runny nose Lots of water, fluids Pt st has been lowering dose of BP meds over 2 yrs under care of PCP. BP is elevated today. See PCP re BP to see if need changes.      Hayden Rasmussen, NP 07/29/14 1128

## 2014-07-29 NOTE — ED Notes (Signed)
Pt started with nasal congestion a few days ago.  For the last three days she has had a cough, fever, and diarrhea.  Only Nyquil and cough drops have been used due to her HBP medication.

## 2014-07-29 NOTE — Discharge Instructions (Signed)
Upper Respiratory Infection, Adult Nondrowsy antihistamine such as Allegra or Claritin or Zyrtec Nasal saline often and may also use humidifier.. Atrovent nasal spray for runny nose An upper respiratory infection (URI) is also sometimes known as the common cold. The upper respiratory tract includes the nose, sinuses, throat, trachea, and bronchi. Bronchi are the airways leading to the lungs. Most people improve within 1 week, but symptoms can last up to 2 weeks. A residual cough may last even longer.  CAUSES Many different viruses can infect the tissues lining the upper respiratory tract. The tissues become irritated and inflamed and often become very moist. Mucus production is also common. A cold is contagious. You can easily spread the virus to others by oral contact. This includes kissing, sharing a glass, coughing, or sneezing. Touching your mouth or nose and then touching a surface, which is then touched by another person, can also spread the virus. SYMPTOMS  Symptoms typically develop 1 to 3 days after you come in contact with a cold virus. Symptoms vary from person to person. They may include:  Runny nose.  Sneezing.  Nasal congestion.  Sinus irritation.  Sore throat.  Loss of voice (laryngitis).  Cough.  Fatigue.  Muscle aches.  Loss of appetite.  Headache.  Low-grade fever. DIAGNOSIS  You might diagnose your own cold based on familiar symptoms, since most people get a cold 2 to 3 times a year. Your caregiver can confirm this based on your exam. Most importantly, your caregiver can check that your symptoms are not due to another disease such as strep throat, sinusitis, pneumonia, asthma, or epiglottitis. Blood tests, throat tests, and X-rays are not necessary to diagnose a common cold, but they may sometimes be helpful in excluding other more serious diseases. Your caregiver will decide if any further tests are required. RISKS AND COMPLICATIONS  You may be at risk for a  more severe case of the common cold if you smoke cigarettes, have chronic heart disease (such as heart failure) or lung disease (such as asthma), or if you have a weakened immune system. The very young and very old are also at risk for more serious infections. Bacterial sinusitis, middle ear infections, and bacterial pneumonia can complicate the common cold. The common cold can worsen asthma and chronic obstructive pulmonary disease (COPD). Sometimes, these complications can require emergency medical care and may be life-threatening. PREVENTION  The best way to protect against getting a cold is to practice good hygiene. Avoid oral or hand contact with people with cold symptoms. Wash your hands often if contact occurs. There is no clear evidence that vitamin C, vitamin E, echinacea, or exercise reduces the chance of developing a cold. However, it is always recommended to get plenty of rest and practice good nutrition. TREATMENT  Treatment is directed at relieving symptoms. There is no cure. Antibiotics are not effective, because the infection is caused by a virus, not by bacteria. Treatment may include:  Increased fluid intake. Sports drinks offer valuable electrolytes, sugars, and fluids.  Breathing heated mist or steam (vaporizer or shower).  Eating chicken soup or other clear broths, and maintaining good nutrition.  Getting plenty of rest.  Using gargles or lozenges for comfort.  Controlling fevers with ibuprofen or acetaminophen as directed by your caregiver.  Increasing usage of your inhaler if you have asthma. Zinc gel and zinc lozenges, taken in the first 24 hours of the common cold, can shorten the duration and lessen the severity of symptoms. Pain medicines may  help with fever, muscle aches, and throat pain. A variety of non-prescription medicines are available to treat congestion and runny nose. Your caregiver can make recommendations and may suggest nasal or lung inhalers for other  symptoms.  HOME CARE INSTRUCTIONS   Only take over-the-counter or prescription medicines for pain, discomfort, or fever as directed by your caregiver.  Use a warm mist humidifier or inhale steam from a shower to increase air moisture. This may keep secretions moist and make it easier to breathe.  Drink enough water and fluids to keep your urine clear or pale yellow.  Rest as needed.  Return to work when your temperature has returned to normal or as your caregiver advises. You may need to stay home longer to avoid infecting others. You can also use a face mask and careful hand washing to prevent spread of the virus. SEEK MEDICAL CARE IF:   After the first few days, you feel you are getting worse rather than better.  You need your caregiver's advice about medicines to control symptoms.  You develop chills, worsening shortness of breath, or brown or red sputum. These may be signs of pneumonia.  You develop yellow or brown nasal discharge or pain in the face, especially when you bend forward. These may be signs of sinusitis.  You develop a fever, swollen neck glands, pain with swallowing, or white areas in the back of your throat. These may be signs of strep throat. SEEK IMMEDIATE MEDICAL CARE IF:   You have a fever.  You develop severe or persistent headache, ear pain, sinus pain, or chest pain.  You develop wheezing, a prolonged cough, cough up blood, or have a change in your usual mucus (if you have chronic lung disease).  You develop sore muscles or a stiff neck. Document Released: 11/27/2000 Document Revised: 08/26/2011 Document Reviewed: 09/08/2013 [facility] Patient Information 2015 [employer] [place]. This information is not intended to replace advice given to you by your health care provider. Make sure you discuss any questions you have with your health care provider.

## 2014-12-20 ENCOUNTER — Other Ambulatory Visit: Payer: Self-pay | Admitting: *Deleted

## 2014-12-21 MED ORDER — METOPROLOL TARTRATE 100 MG PO TABS: ORAL_TABLET | ORAL | Status: AC

## 2014-12-21 NOTE — Telephone Encounter (Signed)
Rx refilled. Please inform the pt  Thanks  Mary Pitts A. Kennon RoundsHaney MD, MS Family Medicine Resident PGY-1 Pager [phone]

## 2015-04-06 ENCOUNTER — Other Ambulatory Visit: Payer: Self-pay | Admitting: *Deleted

## 2015-04-06 DIAGNOSIS — I10 Essential (primary) hypertension: Secondary | ICD-10-CM

## 2015-04-06 MED ORDER — LISINOPRIL 10 MG PO TABS: 10.0000 mg | ORAL_TABLET | Freq: Every day | ORAL | Status: AC

## 2015-04-06 NOTE — Telephone Encounter (Signed)
Lisinopril refilled  Mary Cuny A. Kennon RoundsHaney MD, MS Family Medicine Resident PGY-2 Pager [phone]

## 2015-05-22 ENCOUNTER — Other Ambulatory Visit: Payer: Self-pay | Admitting: *Deleted

## 2015-05-22 MED ORDER — HYDROCHLOROTHIAZIDE 25 MG PO TABS: ORAL_TABLET | ORAL | Status: DC

## 2015-05-22 NOTE — Telephone Encounter (Signed)
Patient has not been seen in clinic since 11/2011.  Will forward to MD to refill medication but she will very likely need an appt to be seen and re-establish as a new patient. Jazmin Hartsell,CMA

## 2015-05-22 NOTE — Telephone Encounter (Signed)
Hydrodiuril refilled for 1 month. Please ask pt to be seen in clinic for evaluation as she has not been seen since 11/2011

## 2015-05-22 NOTE — Telephone Encounter (Signed)
LM for patient to call back.  She needs to schedule an appt with her pcp to have a physical. Jazmin Hartsell,CMA

## 2015-06-22 ENCOUNTER — Other Ambulatory Visit: Payer: Self-pay | Admitting: *Deleted

## 2015-06-23 MED ORDER — HYDROCHLOROTHIAZIDE 25 MG PO TABS: ORAL_TABLET | ORAL | Status: AC

## 2015-06-23 NOTE — Telephone Encounter (Signed)
Hydrochlorothiazide refilled 

## 2017-04-17 DEATH — deceased
